# Patient Record
Sex: Female | Born: 1985 | Race: Black or African American | Hispanic: No | Marital: Single | Smoking: Current some day smoker
Health system: Southern US, Community
[De-identification: ages and names within clinical notes are randomized; demographics above are authoritative.]

## PROBLEM LIST (undated history)

## (undated) DIAGNOSIS — Z789 Other specified health status: Secondary | ICD-10-CM

## (undated) HISTORY — PX: LEG SURGERY: SHX1003

---

## 2016-12-02 ENCOUNTER — Emergency Department (HOSPITAL_COMMUNITY)
Admission: EM | Admit: 2016-12-02 | Discharge: 2016-12-02 | Disposition: A | Discharge status: Home / Self Care | Payer: Self-pay | Attending: Emergency Medicine | Admitting: Emergency Medicine

## 2016-12-02 ENCOUNTER — Emergency Department (HOSPITAL_COMMUNITY): Payer: Self-pay

## 2016-12-02 ENCOUNTER — Encounter (HOSPITAL_COMMUNITY): Payer: Self-pay | Admitting: Emergency Medicine

## 2016-12-02 DIAGNOSIS — R1030 Lower abdominal pain, unspecified: Secondary | ICD-10-CM

## 2016-12-02 DIAGNOSIS — R7989 Other specified abnormal findings of blood chemistry: Secondary | ICD-10-CM

## 2016-12-02 DIAGNOSIS — A599 Trichomoniasis, unspecified: Secondary | ICD-10-CM

## 2016-12-02 DIAGNOSIS — Z91040 Latex allergy status: Secondary | ICD-10-CM | POA: Insufficient documentation

## 2016-12-02 DIAGNOSIS — E349 Endocrine disorder, unspecified: Secondary | ICD-10-CM | POA: Insufficient documentation

## 2016-12-02 DIAGNOSIS — R1032 Left lower quadrant pain: Secondary | ICD-10-CM

## 2016-12-02 LAB — COMPREHENSIVE METABOLIC PANEL
ALBUMIN: 4.3 g/dL (ref 3.5–5.0)
ALT: 11 U/L — ABNORMAL LOW (ref 14–54)
ANION GAP: 6 (ref 5–15)
AST: 23 U/L (ref 15–41)
Alkaline Phosphatase: 37 U/L — ABNORMAL LOW (ref 38–126)
BUN: 9 mg/dL (ref 6–20)
CHLORIDE: 106 mmol/L (ref 101–111)
CO2: 24 mmol/L (ref 22–32)
Calcium: 9.2 mg/dL (ref 8.9–10.3)
Creatinine, Ser: 0.7 mg/dL (ref 0.44–1.00)
GFR calc Af Amer: >60 mL/min (ref >60)
GFR calc non Af Amer: >60 mL/min (ref >60)
GLUCOSE: 84 mg/dL (ref 65–99)
POTASSIUM: 3.8 mmol/L (ref 3.5–5.1)
SODIUM: 136 mmol/L (ref 135–145)
TOTAL PROTEIN: 7.2 g/dL (ref 6.5–8.1)
Total Bilirubin: 1.1 mg/dL (ref 0.3–1.2)

## 2016-12-02 LAB — RPR: RPR Ser Ql: NONREACTIVE

## 2016-12-02 LAB — RH IG WORKUP (INCLUDES ABO/RH)
ABO/RH(D): O POS
Gestational Age(Wks): 1

## 2016-12-02 LAB — URINALYSIS, ROUTINE W REFLEX MICROSCOPIC
BACTERIA UA: NONE SEEN
BILIRUBIN URINE: NEGATIVE
Glucose, UA: NEGATIVE mg/dL
HGB URINE DIPSTICK: NEGATIVE
Ketones, ur: NEGATIVE mg/dL
NITRITE: NEGATIVE
PROTEIN: NEGATIVE mg/dL
Specific Gravity, Urine: 1.018 (ref 1.005–1.030)
pH: 6 (ref 5.0–8.0)

## 2016-12-02 LAB — CBC
HCT: 37.5 % (ref 36.0–46.0)
HEMOGLOBIN: 12.4 g/dL (ref 12.0–15.0)
MCH: 32.4 pg (ref 26.0–34.0)
MCHC: 33.1 g/dL (ref 30.0–36.0)
MCV: 97.9 fL (ref 78.0–100.0)
Platelets: 199 10*3/uL (ref 150–400)
RBC: 3.83 MIL/uL — ABNORMAL LOW (ref 3.87–5.11)
RDW: 13 % (ref 11.5–15.5)
WBC: 4.3 10*3/uL (ref 4.0–10.5)

## 2016-12-02 LAB — I-STAT BETA HCG BLOOD, ED (MC, WL, AP ONLY): HCG, QUANTITATIVE: 225.3 m[IU]/mL — AB (ref <5)

## 2016-12-02 LAB — HCG, QUANTITATIVE, PREGNANCY: HCG, BETA CHAIN, QUANT, S: 207 m[IU]/mL — AB (ref <5)

## 2016-12-02 LAB — WET PREP, GENITAL
Sperm: NONE SEEN
YEAST WET PREP: NONE SEEN

## 2016-12-02 LAB — HIV ANTIBODY (ROUTINE TESTING W REFLEX): HIV SCREEN 4TH GENERATION: NONREACTIVE

## 2016-12-02 LAB — GC/CHLAMYDIA PROBE AMP (~~LOC~~) NOT AT ARMC
Chlamydia: POSITIVE — AB
Neisseria Gonorrhea: NEGATIVE

## 2016-12-02 LAB — LIPASE, BLOOD: LIPASE: 26 U/L (ref 11–51)

## 2016-12-02 MED ORDER — MORPHINE SULFATE (PF) 4 MG/ML IV SOLN
4.0000 mg | Freq: Once | INTRAVENOUS | Status: AC
  Administered 2016-12-02: 4 mg via INTRAVENOUS
  Filled 2016-12-02: qty 1

## 2016-12-02 MED ORDER — SODIUM CHLORIDE 0.9 % IV SOLN
INTRAVENOUS | Status: DC
  Administered 2016-12-02: 07:00:00 via INTRAVENOUS

## 2016-12-02 MED ORDER — METRONIDAZOLE 500 MG PO TABS
500.0000 mg | ORAL_TABLET | Freq: Once | ORAL | Status: AC
  Administered 2016-12-02: 500 mg via ORAL
  Filled 2016-12-02: qty 1

## 2016-12-02 MED ORDER — ONDANSETRON HCL 4 MG/2ML IJ SOLN
4.0000 mg | Freq: Once | INTRAMUSCULAR | Status: AC
  Administered 2016-12-02: 4 mg via INTRAVENOUS
  Filled 2016-12-02: qty 2

## 2016-12-02 MED ORDER — DEXTROSE 5 % IV SOLN
1.0000 g | Freq: Once | INTRAVENOUS | Status: AC
  Administered 2016-12-02: 1 g via INTRAVENOUS
  Filled 2016-12-02: qty 10

## 2016-12-02 MED ORDER — HYDROCODONE-ACETAMINOPHEN 5-325 MG PO TABS
1.0000 | ORAL_TABLET | Freq: Once | ORAL | Status: AC
  Administered 2016-12-02: 1 via ORAL
  Filled 2016-12-02: qty 1

## 2016-12-02 MED ORDER — HYDROCODONE-ACETAMINOPHEN 5-325 MG PO TABS: ORAL_TABLET | ORAL | 0 refills | Status: DC

## 2016-12-02 MED ORDER — AZITHROMYCIN 250 MG PO TABS
1000.0000 mg | ORAL_TABLET | Freq: Once | ORAL | Status: AC
  Administered 2016-12-02: 1000 mg via ORAL
  Filled 2016-12-02: qty 4

## 2016-12-02 MED ORDER — ONDANSETRON 4 MG PO TBDP
4.0000 mg | ORAL_TABLET | Freq: Once | ORAL | Status: AC
  Administered 2016-12-02: 4 mg via ORAL
  Filled 2016-12-02: qty 1

## 2016-12-02 MED ORDER — METRONIDAZOLE 500 MG PO TABS: 500.0000 mg | ORAL_TABLET | Freq: Two times a day (BID) | ORAL | 0 refills | Status: DC

## 2016-12-02 NOTE — ED Provider Notes (Signed)
[facility]-EMERGENCY DEPT Provider Note   CSN: [id number] Arrival date & time: 12/02/16  4098     History   Chief Complaint Chief Complaint  Patient presents with  . Menstrual Problem    HPI   Blood pressure (!) 134/92, pulse (!) 110, temperature 98.5 F (36.9 C), temperature source Oral, resp. rate 18, height 5\' 3"  (1.6 m), weight 50.8 kg (112 lb), last menstrual period 11/16/2016, SpO2 100 %.  Paula Molina is a 31 y.o. female G?P1 complaining of vaginal spotting severe left lower quadrant abdominal pain intermittently but worsening since midnight last night. States that her menses are normally regular, she started menstruating on May 26 and it has been intermittent. She endorses headache shortness of breath and weakness associated. She denies syncope, abnormal vaginal discharge, dysuria, urinary frequency. She notes an episode of severe dyspareunia. States that the abdominal pain is worsened with changing position moving and walking. Pt states that she had been trying for 5 years for get pregnant, but has not  History reviewed. No pertinent past medical history.  There are no active problems to display for this patient.   Past Surgical History:  Procedure Laterality Date  . LEG SURGERY      OB History    No data available       Home Medications    Prior to Admission medications   Medication Sig Start Date End Date Taking? Authorizing Provider  aspirin-acetaminophen-caffeine (EXCEDRIN MIGRAINE) [id number] MG tablet Take 1 tablet by mouth every 6 (six) hours as needed for headache.   Yes [provider]  Aspirin-Acetaminophen-Caffeine (GOODY HEADACHE PO) Take 1 packet by mouth daily as needed (headache).   Yes [provider]  HYDROcodone-acetaminophen (NORCO/VICODIN) 5-325 MG tablet Take 1-2 tablets by mouth every 6 hours as needed for pain and/or cough. 12/02/16   Sherel Fennell, Joni Reining, PA-C  metroNIDAZOLE (FLAGYL) 500 MG tablet Take 1 tablet (500 mg  total) by mouth 2 (two) times daily. 12/02/16   Audrena Talaga, Mardella Layman    Family History No family history on file.  Social History Social History  Substance Use Topics  . Smoking status: Never Smoker  . Smokeless tobacco: Never Used  . Alcohol use Yes     Comment: rare     Allergies   Latex   Review of Systems Review of Systems  10 systems reviewed and found to be negative, except as noted in the HPI.   Physical Exam Updated Vital Signs BP 112/72   Pulse 78   Temp 98.5 F (36.9 C) (Oral)   Resp 16   Ht 5\' 3"  (1.6 m)   Wt 50.8 kg (112 lb)   LMP 11/16/2016   SpO2 100%   BMI 19.84 kg/m   Physical Exam  Constitutional: She is oriented to person, place, and time. She appears well-developed and well-nourished. No distress.  HENT:  Head: Normocephalic.  Mouth/Throat: Oropharynx is clear and moist.  Eyes: Conjunctivae and EOM are normal.  Neck: Normal range of motion.  Cardiovascular: Normal rate.   Pulmonary/Chest: Effort normal. No stridor.  Abdominal: Soft. Bowel sounds are normal. She exhibits no distension and no mass. There is tenderness. There is no rebound and no guarding.  Tender in the left lower quadrant without guarding or rebound, normoactive bowel sounds.  Genitourinary:  Genitourinary Comments: No CVA tenderness to percussion bilaterally  Pelvic exam is chaperoned by technician: No rashes or lesions, scant dark blood pooled in the posterior fornix. Very very mild tenderness on left adnexal palpation.  Musculoskeletal: Normal range of motion.  Neurological: She is alert and oriented to person, place, and time.  Psychiatric: She has a normal mood and affect.  Nursing note and vitals reviewed.    ED Treatments / Results  Labs (all labs ordered are listed, but only abnormal results are displayed) Labs Reviewed  WET PREP, GENITAL - Abnormal; Notable for the following:       Result Value   Trich, Wet Prep PRESENT (*)    Clue Cells Wet Prep HPF  POC PRESENT (*)    WBC, Wet Prep HPF POC MANY (*)    All other components within normal limits  COMPREHENSIVE METABOLIC PANEL - Abnormal; Notable for the following:    ALT 11 (*)    Alkaline Phosphatase 37 (*)    All other components within normal limits  CBC - Abnormal; Notable for the following:    RBC 3.83 (*)    All other components within normal limits  URINALYSIS, ROUTINE W REFLEX MICROSCOPIC - Abnormal; Notable for the following:    Leukocytes, UA TRACE (*)    Squamous Epithelial / LPF 0-5 (*)    All other components within normal limits  HCG, QUANTITATIVE, PREGNANCY - Abnormal; Notable for the following:    hCG, Beta Chain, Quant, S 207 (*)    All other components within normal limits  I-STAT BETA HCG BLOOD, ED ([facility], [facility], [facility] ONLY) - Abnormal; Notable for the following:    I-stat hCG, quantitative 225.3 (*)    All other components within normal limits  URINE CULTURE  LIPASE, BLOOD  RPR  HIV ANTIBODY (ROUTINE TESTING)  RH IG WORKUP (INCLUDES ABO/RH)  GC/CHLAMYDIA PROBE AMP ([place]) NOT AT [facility]    EKG  EKG Interpretation None       Radiology US Ob Comp Less 14 Wks  Result Date: 12/02/2016 CLINICAL DATA:  Bleeding since May 26. Left pelvic pain. Quantitative beta HCG 207. EXAM: OBSTETRIC <14 WK [place] AND TRANSVAGINAL OB US DOPPLER ULTRASOUND OF OVARIES TECHNIQUE: Both transabdominal and transvaginal ultrasound examinations were performed for complete evaluation of the gestation as well as the maternal uterus, adnexal regions, and pelvic cul-de-sac. Transvaginal technique was performed to assess early pregnancy. Color and duplex Doppler ultrasound was utilized to evaluate blood flow to the ovaries. COMPARISON:  None. FINDINGS: No intra or extrauterine gestational sac is identified. No extra ovarian mass. No complex or large volume pelvic fluid. The uterus is retroflexed, otherwise unremarkable. A nonvascular mass in the right ovary with internal lace-like appearance  consistent with a hemorrhagic cyst, up to 3.7 cm in diameter. The symptomatic left ovary is normal in size and appearance. Pulsed Doppler evaluation of both ovaries demonstrates normal appearing low-resistance arterial and venous waveforms. IMPRESSION: 1. Pregnancy of unknown location. Differential considerations include intrauterine gestation too early to be sonographically visualized, spontaneous abortion, or ectopic pregnancy. Consider follow-up ultrasound in 10 days and serial quantitative beta HCG follow-up. 2. Negative for ovarian torsion. On the symptomatic left side, the ovary has a normal appearance. 3. 3.7 cm probable hemorrhagic cyst in the right ovary. Electronically Signed   By: Marnee Spring M.D.   On: 12/02/2016 08:46   US Ob Transvaginal  Result Date: 12/02/2016 CLINICAL DATA:  Bleeding since May 26. Left pelvic pain. Quantitative beta HCG 207. EXAM: OBSTETRIC <14 WK [place] AND TRANSVAGINAL OB US DOPPLER ULTRASOUND OF OVARIES TECHNIQUE: Both transabdominal and transvaginal ultrasound examinations were performed for complete evaluation of the gestation as well as the maternal uterus, adnexal  regions, and pelvic cul-de-sac. Transvaginal technique was performed to assess early pregnancy. Color and duplex Doppler ultrasound was utilized to evaluate blood flow to the ovaries. COMPARISON:  None. FINDINGS: No intra or extrauterine gestational sac is identified. No extra ovarian mass. No complex or large volume pelvic fluid. The uterus is retroflexed, otherwise unremarkable. A nonvascular mass in the right ovary with internal lace-like appearance consistent with a hemorrhagic cyst, up to 3.7 cm in diameter. The symptomatic left ovary is normal in size and appearance. Pulsed Doppler evaluation of both ovaries demonstrates normal appearing low-resistance arterial and venous waveforms. IMPRESSION: 1. Pregnancy of unknown location. Differential considerations include intrauterine gestation too early to be  sonographically visualized, spontaneous abortion, or ectopic pregnancy. Consider follow-up ultrasound in 10 days and serial quantitative beta HCG follow-up. 2. Negative for ovarian torsion. On the symptomatic left side, the ovary has a normal appearance. 3. 3.7 cm probable hemorrhagic cyst in the right ovary. Electronically Signed   By: Marnee Spring M.D.   On: 12/02/2016 08:46   [place] Art/ven Flow Abd Pelv Doppler  Result Date: 12/02/2016 CLINICAL DATA:  Bleeding since May 26. Left pelvic pain. Quantitative beta HCG 207. EXAM: OBSTETRIC <14 WK [place] AND TRANSVAGINAL OB US DOPPLER ULTRASOUND OF OVARIES TECHNIQUE: Both transabdominal and transvaginal ultrasound examinations were performed for complete evaluation of the gestation as well as the maternal uterus, adnexal regions, and pelvic cul-de-sac. Transvaginal technique was performed to assess early pregnancy. Color and duplex Doppler ultrasound was utilized to evaluate blood flow to the ovaries. COMPARISON:  None. FINDINGS: No intra or extrauterine gestational sac is identified. No extra ovarian mass. No complex or large volume pelvic fluid. The uterus is retroflexed, otherwise unremarkable. A nonvascular mass in the right ovary with internal lace-like appearance consistent with a hemorrhagic cyst, up to 3.7 cm in diameter. The symptomatic left ovary is normal in size and appearance. Pulsed Doppler evaluation of both ovaries demonstrates normal appearing low-resistance arterial and venous waveforms. IMPRESSION: 1. Pregnancy of unknown location. Differential considerations include intrauterine gestation too early to be sonographically visualized, spontaneous abortion, or ectopic pregnancy. Consider follow-up ultrasound in 10 days and serial quantitative beta HCG follow-up. 2. Negative for ovarian torsion. On the symptomatic left side, the ovary has a normal appearance. 3. 3.7 cm probable hemorrhagic cyst in the right ovary. Electronically Signed   By: Marnee Spring M.D.   On: 12/02/2016 08:46    Procedures Procedures (including critical care time)  Medications Ordered in ED Medications  0.9 %  sodium chloride infusion ( Intravenous Stopped 12/02/16 1154)  morphine 4 MG/ML injection 4 mg (4 mg Intravenous Given 12/02/16 0659)  ondansetron (ZOFRAN) injection 4 mg (4 mg Intravenous Given 12/02/16 0659)  morphine 4 MG/ML injection 4 mg (4 mg Intravenous Given 12/02/16 0907)  ondansetron (ZOFRAN) injection 4 mg (4 mg Intravenous Given 12/02/16 1045)  cefTRIAXone (ROCEPHIN) 1 g in dextrose 5 % 50 mL IVPB (0 g Intravenous Stopped 12/02/16 1156)  azithromycin (ZITHROMAX) tablet 1,000 mg (1,000 mg Oral Given 12/02/16 1058)  metroNIDAZOLE (FLAGYL) tablet 500 mg (500 mg Oral Given 12/02/16 1058)     Initial Impression / Assessment and Plan / ED Course  I have reviewed the triage vital signs and the nursing notes.  Pertinent labs & imaging results that were available during my care of the patient were reviewed by me and considered in my medical decision making (see chart for details).     Vitals:   12/02/16 1045 12/02/16 1100 12/02/16 1135  12/02/16 1146  BP: (!) 120/59 (!) 141/86 108/78 112/72  Pulse: 66 74 70 78  Resp: 16  16 16   Temp:      TempSrc:      SpO2: 100% 100% 100% 100%  Weight:      Height:        Medications  0.9 %  sodium chloride infusion ( Intravenous Stopped 12/02/16 1154)  morphine 4 MG/ML injection 4 mg (4 mg Intravenous Given 12/02/16 0659)  ondansetron (ZOFRAN) injection 4 mg (4 mg Intravenous Given 12/02/16 0659)  morphine 4 MG/ML injection 4 mg (4 mg Intravenous Given 12/02/16 0907)  ondansetron (ZOFRAN) injection 4 mg (4 mg Intravenous Given 12/02/16 1045)  cefTRIAXone (ROCEPHIN) 1 g in dextrose 5 % 50 mL IVPB (0 g Intravenous Stopped 12/02/16 1156)  azithromycin (ZITHROMAX) tablet 1,000 mg (1,000 mg Oral Given 12/02/16 1058)  metroNIDAZOLE (FLAGYL) tablet 500 mg (500 mg Oral Given 12/02/16 1058)    Paula Molina is 31  y.o. female presenting with Vaginal spotting with severe left lower quadrant pain. Concern for ectopic, patient made nothing by mouth. Abdominal exam is nonsurgical. Initially tachycardic however, this is resolved on my exam. Associated symptoms of weakness, headache and shortness of breath. Patient O+, CBC with no significant anemia. I-STAT beta hCG confirmed with a quantitative hCG of 207.   Ultrasound with no intra-or extrauterine signs of positional sac. Incidentally noted is a right-sided ovarian cyst. Consider early pregnancy versus completed abortion versus ectopic. Will consult OB/GYN.  Wet prep with trichomoniasis, clue cells and many white blood cells.  Discussed case with attending physician who agrees with care plan and disposition.   OB/GYN consult from Dr. Vergie LivingPickens appreciated: He advises to treat for PID with Rocephin and azithromycin. He has looked at the ultrasound and agrees that she is stable for discharge states that [facility] to [facility] up an appointment for repeat drug blood draw in 2 days. Counseled her that her partner will need to be treated for trichomoniasis.  Extensive discussion of return for cautions for ectopic. Patient verbalized her understanding and teach back technique.  Discussed treatment options with pharmacist Twe as metronidazole is not recommended in the first trimester. She is discussed this with [facility] confirms that they do use metronidazole and the first trimester. She recommends initiating treatment.   Final Clinical Impressions(s) / ED Diagnoses   Final diagnoses:  Lower abdominal pain  Elevated serum hCG  LLQ pain  Trichimoniasis    New Prescriptions New Prescriptions   HYDROCODONE-ACETAMINOPHEN (NORCO/VICODIN) 5-325 MG TABLET    Take 1-2 tablets by mouth every 6 hours as needed for pain and/or cough.   METRONIDAZOLE (FLAGYL) 500 MG TABLET    Take 1 tablet (500 mg total) by mouth 2 (two) times daily.      Kaylyn Limisciotta, Juanita Streight, PA-C 12/02/16 1225    Arby BarrettePfeiffer, Marcy, MD 12/04/16 [phone]

## 2016-12-02 NOTE — ED Triage Notes (Signed)
Pt also c/o having nausea and dizziness

## 2016-12-02 NOTE — Discharge Instructions (Signed)
It is very important that you follow-up for repeat blood work in 48 hours. Do not hesitate to return to the [facility] or any close emergency room if you have worsening abdominal pain, heavy vaginal bleeding, feel like you're going to pass out, feel short of breath or  heart is racing.

## 2016-12-02 NOTE — ED Notes (Signed)
Gave pt saltine crackers, peanut butter & water, per Joni ReiningNicole - PA.

## 2016-12-02 NOTE — ED Triage Notes (Signed)
Pt reports vaginal bleeding ongoing since May 26th. Pt reports vaginal bleed  Stopped for 1 day (5 days after the 26th), then started back up again after having intercourse. Pt also reports severe abdominal pain during intercourse and continues today.

## 2016-12-02 NOTE — ED Notes (Signed)
Patient transported back from US 

## 2016-12-02 NOTE — ED Notes (Signed)
Patient given food per edp approval.

## 2016-12-03 LAB — URINE CULTURE: Culture: <10000 — AB

## 2016-12-04 ENCOUNTER — Ambulatory Visit: Payer: Self-pay

## 2016-12-04 ENCOUNTER — Encounter (HOSPITAL_COMMUNITY): Payer: Self-pay | Admitting: *Deleted

## 2016-12-04 ENCOUNTER — Inpatient Hospital Stay (HOSPITAL_COMMUNITY)
Admission: AD | Admit: 2016-12-04 | Discharge: 2016-12-04 | Disposition: A | Discharge status: Home / Self Care | Payer: Self-pay | Source: Ambulatory Visit | Attending: Obstetrics & Gynecology | Admitting: Obstetrics & Gynecology

## 2016-12-04 ENCOUNTER — Inpatient Hospital Stay (HOSPITAL_COMMUNITY): Payer: Self-pay

## 2016-12-04 DIAGNOSIS — Z3A00 Weeks of gestation of pregnancy not specified: Secondary | ICD-10-CM | POA: Insufficient documentation

## 2016-12-04 DIAGNOSIS — O26899 Other specified pregnancy related conditions, unspecified trimester: Secondary | ICD-10-CM | POA: Insufficient documentation

## 2016-12-04 DIAGNOSIS — O3680X0 Pregnancy with inconclusive fetal viability, not applicable or unspecified: Secondary | ICD-10-CM | POA: Insufficient documentation

## 2016-12-04 DIAGNOSIS — Z3201 Encounter for pregnancy test, result positive: Secondary | ICD-10-CM | POA: Insufficient documentation

## 2016-12-04 DIAGNOSIS — O26891 Other specified pregnancy related conditions, first trimester: Secondary | ICD-10-CM

## 2016-12-04 DIAGNOSIS — R102 Pelvic and perineal pain: Secondary | ICD-10-CM

## 2016-12-04 DIAGNOSIS — Z7982 Long term (current) use of aspirin: Secondary | ICD-10-CM | POA: Insufficient documentation

## 2016-12-04 DIAGNOSIS — Z9889 Other specified postprocedural states: Secondary | ICD-10-CM | POA: Insufficient documentation

## 2016-12-04 DIAGNOSIS — N730 Acute parametritis and pelvic cellulitis: Secondary | ICD-10-CM | POA: Insufficient documentation

## 2016-12-04 DIAGNOSIS — Z91040 Latex allergy status: Secondary | ICD-10-CM | POA: Insufficient documentation

## 2016-12-04 DIAGNOSIS — Z3491 Encounter for supervision of normal pregnancy, unspecified, first trimester: Secondary | ICD-10-CM

## 2016-12-04 LAB — CBC
HCT: 38.7 % (ref 36.0–46.0)
HEMOGLOBIN: 13 g/dL (ref 12.0–15.0)
MCH: 32.7 pg (ref 26.0–34.0)
MCHC: 33.6 g/dL (ref 30.0–36.0)
MCV: 97.2 fL (ref 78.0–100.0)
Platelets: 178 10*3/uL (ref 150–400)
RBC: 3.98 MIL/uL (ref 3.87–5.11)
RDW: 13.1 % (ref 11.5–15.5)
WBC: 3.9 10*3/uL — ABNORMAL LOW (ref 4.0–10.5)

## 2016-12-04 LAB — HCG, QUANTITATIVE, PREGNANCY: HCG, BETA CHAIN, QUANT, S: 229 m[IU]/mL — AB (ref <5)

## 2016-12-04 MED ORDER — OXYCODONE-ACETAMINOPHEN 5-325 MG PO TABS
1.0000 | ORAL_TABLET | Freq: Four times a day (QID) | ORAL | Status: DC | PRN
Start: 2016-12-04 — End: 2016-12-04
  Administered 2016-12-04: 1 via ORAL
  Filled 2016-12-04: qty 1

## 2016-12-04 MED ORDER — IBUPROFEN 800 MG PO TABS: 800.0000 mg | ORAL_TABLET | Freq: Three times a day (TID) | ORAL | 0 refills | Status: DC | PRN

## 2016-12-04 NOTE — MAU Provider Note (Signed)
History     CSN: [id number]  Arrival date and time: 12/04/16 1141   First Provider Initiated Contact with Patient 12/04/16 1203      No chief complaint on file.  G2P1001 at unknown gestation here with worsening LLQ pain. Pain became worse this am. She rates 8/10. She has not taken anything for it. She describes as a pulling from her lower abdomen to her rectum, more pain on left. She was seen in the ED 2 days ago for similar pain and was found to have a positive pregnancy test and no IUP or obvious ectopic (incidentally rt hemorrhagic cyst), and was treated for PID. Trich and CT was positive. She also reports bleeding or spotting for the last 2 1/2 weeks. Flow today is light and dark red. She has used 3 tampons today, partially soaked.    OB History    Gravida Para Term Preterm AB Living   2 1 1     1    SAB TAB Ectopic Multiple Live Births           1      History reviewed. No pertinent past medical history.  Past Surgical History:  Procedure Laterality Date  . LEG SURGERY      History reviewed. No pertinent family history.  Social History  Substance Use Topics  . Smoking status: Never Smoker  . Smokeless tobacco: Never Used  . Alcohol use Yes     Comment: rare    Allergies:  Allergies  Allergen Reactions  . Latex Rash    Prescriptions Prior to Admission  Medication Sig Dispense Refill Last Dose  . aspirin-acetaminophen-caffeine (EXCEDRIN MIGRAINE) 250-250-65 MG tablet Take 1 tablet by mouth every 6 (six) hours as needed for headache.   Past Month at Unknown time  . Aspirin-Acetaminophen-Caffeine (GOODY HEADACHE PO) Take 1 packet by mouth daily as needed (headache).   Past Week at Unknown time  . HYDROcodone-acetaminophen (NORCO/VICODIN) 5-325 MG tablet Take 1-2 tablets by mouth every 6 hours as needed for pain and/or cough. 7 tablet 0 Past Week at Unknown time  . metroNIDAZOLE (FLAGYL) 500 MG tablet Take 1 tablet (500 mg total) by mouth 2 (two) times daily. 14  tablet 0 12/03/2016 at Unknown time    Review of Systems  Constitutional: Negative for chills and fever.  Gastrointestinal: Positive for abdominal pain (LLQ).  Genitourinary: Positive for vaginal bleeding.   Physical Exam   Blood pressure 123/83, pulse 72, temperature 98.1 F (36.7 C), resp. rate 16, last menstrual period 11/16/2016.  Physical Exam  Nursing note and vitals reviewed. Constitutional: She is oriented to person, place, and time. She appears well-developed and well-nourished. No distress.  HENT:  Head: Normocephalic and atraumatic.  Neck: Normal range of motion.  Cardiovascular: Normal rate.   Respiratory: Effort normal.  GI: Soft. She exhibits no distension and no mass. There is no tenderness. There is no rebound and no guarding.  Genitourinary:  Genitourinary Comments: External: no lesions or erythema Vagina: rugated, pink, moist, scant brown discharge Uterus: non enlarged, anteverted, + tender, ++ CMT Adnexae: no masses, no tenderness left, no tenderness right   Musculoskeletal: Normal range of motion.  Neurological: She is alert and oriented to person, place, and time.  Skin: Skin is warm and dry.  Psychiatric: She has a normal mood and affect.   Results for orders placed or performed during the hospital encounter of 12/04/16 (from the past 24 hour(s))  CBC     Status: Abnormal   Collection  Time: 12/04/16 12:07 PM  Result Value Ref Range   WBC 3.9 (L) 4.0 - 10.5 K/uL   RBC 3.98 3.87 - 5.11 MIL/uL   Hemoglobin 13.0 12.0 - 15.0 g/dL   HCT 09.838.7 11.936.0 - 14.746.0 %   MCV 97.2 78.0 - 100.0 fL   MCH 32.7 26.0 - 34.0 pg   MCHC 33.6 30.0 - 36.0 g/dL   RDW 82.913.1 56.211.5 - 13.015.5 %   Platelets 178 150 - 400 K/uL  hCG, quantitative, pregnancy     Status: Abnormal   Collection Time: 12/04/16 12:07 PM  Result Value Ref Range   hCG, Beta Chain, Quant, S 229 (H) <5 mIU/mL   [place] Ob Transvaginal  Result Date: 12/04/2016 CLINICAL DATA:  Pregnancy of unknown location, left pelvic  pain, bleeding x3 days, beta HCG 229, previously 207 on 06/11 EXAM: TRANSVAGINAL OB ULTRASOUND TECHNIQUE: Transvaginal ultrasound was performed for complete evaluation of the gestation as well as the maternal uterus, adnexal regions, and pelvic cul-de-sac. COMPARISON:  12/02/2016 FINDINGS: Intrauterine gestational sac: None Yolk sac:  Not Visualized. Embryo:  Not Visualized. Subchorionic hemorrhage:  None visualized. Maternal uterus/adnexae: Endometrial complex measures 7 mm. 3.0 cm right ovarian hemorrhagic cyst. Left ovary is within normal limits. No free fluid. IMPRESSION: No IUP is visualized. By definition, in the setting of a positive pregnancy test, this reflects a pregnancy of unknown location. Differential considerations include abnormal IUP/missed abortion or nonvisualized ectopic pregnancy. Early normal IUP is not considered within the differential given the inappropriate rise in beta HCG. Serial beta HCG is suggested. Consider repeat pelvic ultrasound in 14 days, or earlier as clinically warranted. Electronically Signed   By: Charline BillsSriyesh  Krishnan M.D.   On: 12/04/2016 15:07   MAU Course  Procedures Percocet 2 tabs  MDM Labs ordered and reviewed. Pain improved after Percocet. Discussed with pt abnormal rise in quant HCG and no IUP seen again on todays US makes this suspicious for ectopic pregnancy, which can be life threatening. Quant HCG is low which may also indicate failed pregnancy or early pregnancy. Mngt options include MTX or rpt quant in 2 days and if continues to have abnormal rise then would recommend MTX then. She expresses this is a desired pregnancy as she was told 8 years ago she could no longer have children. She desires expectant mngt and rpt quant in 2 days. I reviewed strict ectopic precautions. Stable for discharge home.  Assessment and Plan   1. Pregnancy of unknown anatomic location   2. Pelvic pain affecting pregnancy   3. PID (acute pelvic inflammatory disease)     Discharge home Follow up in 2 days in [facility] at 8:00 am for labs Ectopic/return precautions Rx Ibuprofen   Allergies as of 12/04/2016      Reactions   Latex Rash      Medication List    STOP taking these medications   aspirin-acetaminophen-caffeine 250-250-65 MG tablet Commonly known as:  EXCEDRIN MIGRAINE   GOODY HEADACHE PO     TAKE these medications   HYDROcodone-acetaminophen 5-325 MG tablet Commonly known as:  NORCO/VICODIN Take 1-2 tablets by mouth every 6 hours as needed for pain and/or cough.   ibuprofen 800 MG tablet Commonly known as:  ADVIL,MOTRIN Take 1 tablet (800 mg total) by mouth every 8 (eight) hours as needed.   metroNIDAZOLE 500 MG tablet Commonly known as:  FLAGYL Take 1 tablet (500 mg total) by mouth 2 (two) times daily.      Donette LarryMelanie Adalai Perl, CNM 12/04/2016, 12:27  PM  

## 2016-12-04 NOTE — Discharge Instructions (Signed)

## 2016-12-04 NOTE — MAU Note (Signed)
Pt presents via EMS with complaints of pain in her left lower abdomen. [facility] and [facility]. Reports small amount of dark vaginal bleeding and is presently wearing a tampon

## 2016-12-06 ENCOUNTER — Inpatient Hospital Stay (HOSPITAL_COMMUNITY)
Admission: AD | Admit: 2016-12-06 | Discharge: 2016-12-06 | Disposition: A | Discharge status: Home / Self Care | Payer: Self-pay | Source: Ambulatory Visit | Attending: Obstetrics and Gynecology | Admitting: Obstetrics and Gynecology

## 2016-12-06 ENCOUNTER — Ambulatory Visit: Payer: Self-pay

## 2016-12-06 ENCOUNTER — Encounter (HOSPITAL_COMMUNITY): Payer: Self-pay

## 2016-12-06 DIAGNOSIS — O9989 Other specified diseases and conditions complicating pregnancy, childbirth and the puerperium: Secondary | ICD-10-CM

## 2016-12-06 DIAGNOSIS — Z87891 Personal history of nicotine dependence: Secondary | ICD-10-CM | POA: Insufficient documentation

## 2016-12-06 DIAGNOSIS — Z3200 Encounter for pregnancy test, result unknown: Secondary | ICD-10-CM

## 2016-12-06 DIAGNOSIS — O3680X0 Pregnancy with inconclusive fetal viability, not applicable or unspecified: Secondary | ICD-10-CM

## 2016-12-06 DIAGNOSIS — O26891 Other specified pregnancy related conditions, first trimester: Secondary | ICD-10-CM | POA: Insufficient documentation

## 2016-12-06 DIAGNOSIS — O4690 Antepartum hemorrhage, unspecified, unspecified trimester: Secondary | ICD-10-CM

## 2016-12-06 DIAGNOSIS — Z3A01 Less than 8 weeks gestation of pregnancy: Secondary | ICD-10-CM | POA: Insufficient documentation

## 2016-12-06 DIAGNOSIS — R109 Unspecified abdominal pain: Secondary | ICD-10-CM | POA: Insufficient documentation

## 2016-12-06 DIAGNOSIS — O209 Hemorrhage in early pregnancy, unspecified: Secondary | ICD-10-CM | POA: Insufficient documentation

## 2016-12-06 HISTORY — DX: Other specified health status: Z78.9

## 2016-12-06 LAB — CBC WITH DIFFERENTIAL/PLATELET
Basophils Absolute: 0 10*3/uL (ref 0.0–0.1)
Basophils Relative: 0 %
Eosinophils Absolute: 0 10*3/uL (ref 0.0–0.7)
Eosinophils Relative: 0 %
HCT: 35.9 % — ABNORMAL LOW (ref 36.0–46.0)
Hemoglobin: 12.3 g/dL (ref 12.0–15.0)
LYMPHS PCT: 36 %
Lymphs Abs: 1.3 10*3/uL (ref 0.7–4.0)
MCH: 33.2 pg (ref 26.0–34.0)
MCHC: 34.3 g/dL (ref 30.0–36.0)
MCV: 96.8 fL (ref 78.0–100.0)
MONO ABS: 0.2 10*3/uL (ref 0.1–1.0)
MONOS PCT: 6 %
Neutro Abs: 2.1 10*3/uL (ref 1.7–7.7)
Neutrophils Relative %: 58 %
Platelets: 200 10*3/uL (ref 150–400)
RBC: 3.71 MIL/uL — ABNORMAL LOW (ref 3.87–5.11)
RDW: 13.2 % (ref 11.5–15.5)
WBC: 3.7 10*3/uL — ABNORMAL LOW (ref 4.0–10.5)

## 2016-12-06 LAB — COMPREHENSIVE METABOLIC PANEL
ALBUMIN: 4.3 g/dL (ref 3.5–5.0)
ALK PHOS: 34 U/L — AB (ref 38–126)
ALT: 12 U/L — ABNORMAL LOW (ref 14–54)
ANION GAP: 4 — AB (ref 5–15)
AST: 17 U/L (ref 15–41)
BUN: 15 mg/dL (ref 6–20)
CO2: 24 mmol/L (ref 22–32)
Calcium: 9.2 mg/dL (ref 8.9–10.3)
Chloride: 107 mmol/L (ref 101–111)
Creatinine, Ser: 0.63 mg/dL (ref 0.44–1.00)
GFR calc Af Amer: >60 mL/min (ref >60)
GFR calc non Af Amer: >60 mL/min (ref >60)
GLUCOSE: 82 mg/dL (ref 65–99)
POTASSIUM: 3.9 mmol/L (ref 3.5–5.1)
SODIUM: 135 mmol/L (ref 135–145)
Total Bilirubin: 0.5 mg/dL (ref 0.3–1.2)
Total Protein: 7.7 g/dL (ref 6.5–8.1)

## 2016-12-06 LAB — HCG, QUANTITATIVE, PREGNANCY: hCG, Beta Chain, Quant, S: 238 m[IU]/mL — ABNORMAL HIGH (ref <5)

## 2016-12-06 MED ORDER — METHOTREXATE INJECTION FOR WOMEN'S HOSPITAL
50.0000 mg/m2 | Freq: Once | INTRAMUSCULAR | Status: AC
  Administered 2016-12-06: 75 mg via INTRAMUSCULAR
  Filled 2016-12-06: qty 1.5

## 2016-12-06 NOTE — Progress Notes (Addendum)
G2P1 @ 2.[redacted] wksga. Presents to triage for bleeding and abdominal pain "fullness of abdomen" 4/10 pain level. Bleeding for about two weeks. has constipation and diarrhea.   1200: Provider at bs assessing pt. POC discussed with pt. Verbal order received that pt will get methatrexate.   Pending lab results. No order for methatrexate in cc yet.   1330: pt out side of room inquiring about the medicine. Pt appears upset and raising her voice and interrupting the charge nurse when nurse trying to explain. Pt demanding how long more she has to wait since she needed to pick up her child. Charge nurse informed may take 30 mins to process medication. Pt mumbled underbreath about rudeness when going back to room  Proivider called for med order since lab results back by charge nurse. Orders placed by provider. Pharmacy called to expedite medication.   1335: Went to pt's room to explain that pharmacy notified bc med order may take 30-40 mins to process but had called to expedite medication order.  Pt raising voice and cussing "f" word and asking why she was told that process here would take 2 hrs when it has now been 5 hrs since she came to her initial appt downstairs at 0800. I suggested that the FOB pickup child but she cussed me out telling me "don't tell me how to plan out my day". I cont to explain but pt kept on interrupting and raising voice. I asked pt, can I cont and explain? I informed that she will  need to wait 30 mins after med administion. "F bomb used again.   1347: medication received from pharmacy. Entered room with another nurse (orientee). Pt still acting out. Commented to orientee "i like youDesigner, fashion/clothing" Charge nurse called into room to administer methatrexate. Missing a piece on syringe. Pharmacy called for correction by charge nurse  1350: [facility] and her orientee.

## 2016-12-06 NOTE — Progress Notes (Signed)
Pt here today for STAT beta for unknown pregnancy location.  Pt states that she is still having some bleeding and continued abdominal pain.  Pt did not have any further questions.  I advised pt to please stay in the waiting room until her get her results which should take up to two hours.  Pt stated understanding with no further questions.  Notified Dr. Marice Potterove of pt's beta results.  Provider recommended to have pt go to MAU for management.  Pt notified of providers recommendation.  Notified MAU charge nurse.  Pt had no further questions.  Pt walked to MAU.

## 2016-12-06 NOTE — MAU Note (Signed)
Sent from clinic.  Continues to bleed and have pain, times in the day where both are worse.  Has been bleeding 5/26.  Feels like her whole abd is full, bloated sensation.  Going back and forth between constipation and diarrhea

## 2016-12-06 NOTE — MAU Provider Note (Signed)
History     CSN: [id number]  Arrival date and time: 12/06/16 1122   None     Chief Complaint  Patient presents with  . poss ectopic   HPI   Ms.Paula Molina is a 31 y.o. female G2P1001 @ 47w6dhere in MAU with abdominal pain. The pain is in both sides of her lower abdomen. " I feel bloated and full". The pain seems to linger on the left side. She has continued to have bleeding since 5/26. Patient says she is upset however knows this is not a normal pregnancy.   OB History    Gravida Para Term Preterm AB Living   2 1 1     1    SAB TAB Ectopic Multiple Live Births           1      Past Medical History:  Diagnosis Date  . Medical history non-contributory     Past Surgical History:  Procedure Laterality Date  . LEG SURGERY      Family History  Problem Relation Age of Onset  . Cancer Mother   . Depression Mother   . Hypertension Mother   . Miscarriages / [place]   . Cancer Father   . Asthma Brother   . Alcohol abuse Maternal Aunt   . Asthma Maternal Aunt   . Depression Maternal Aunt   . Hypertension Maternal Aunt   . Diabetes Maternal Uncle   . Hypertension Maternal Uncle   . Cancer Paternal Aunt   . Asthma Maternal Grandmother   . Diabetes Maternal Grandmother   . Hypertension Maternal Grandmother   . Miscarriages / Stillbirths Maternal Grandmother   . Varicose Veins Maternal Grandmother   . Arthritis Paternal Grandmother   . Cancer Paternal Grandmother   . Diabetes Paternal Grandmother   . Hypertension Paternal Grandmother     Social History  Substance Use Topics  . Smoking status: Former SResearch scientist (life sciences) . Smokeless tobacco: Never Used  . Alcohol use Yes     Comment: rare    Allergies:  Allergies  Allergen Reactions  . Latex Rash    Prescriptions Prior to Admission  Medication Sig Dispense Refill Last Dose  . HYDROcodone-acetaminophen (NORCO/VICODIN) 5-325 MG tablet Take 1-2 tablets by mouth every 6 hours as needed for pain and/or  cough. 7 tablet 0 Past Week at Unknown time  . ibuprofen (ADVIL,MOTRIN) 800 MG tablet Take 1 tablet (800 mg total) by mouth every 8 (eight) hours as needed. 30 tablet 0   . metroNIDAZOLE (FLAGYL) 500 MG tablet Take 1 tablet (500 mg total) by mouth 2 (two) times daily. 14 tablet 0 12/03/2016 at Unknown time   Results for orders placed or performed during the hospital encounter of 12/06/16 (from the past 48 hour(s))  Comprehensive metabolic panel     Status: Abnormal   Collection Time: 12/06/16 12:45 PM  Result Value Ref Range   Sodium 135 135 - 145 mmol/L   Potassium 3.9 3.5 - 5.1 mmol/L   Chloride 107 101 - 111 mmol/L   CO2 24 22 - 32 mmol/L   Glucose, Bld 82 65 - 99 mg/dL   BUN 15 6 - 20 mg/dL   Creatinine, Ser 0.63 0.44 - 1.00 mg/dL   Calcium 9.2 8.9 - 10.3 mg/dL   Total Protein 7.7 6.5 - 8.1 g/dL   Albumin 4.3 3.5 - 5.0 g/dL   AST 17 15 - 41 U/L   ALT 12 (L) 14 - 54 U/L  Alkaline Phosphatase 34 (L) 38 - 126 U/L   Total Bilirubin 0.5 0.3 - 1.2 mg/dL   GFR calc non Af Amer >60 >60 mL/min   GFR calc Af Amer >60 >60 mL/min    Comment: (NOTE) The eGFR has been calculated using the CKD EPI equation. This calculation has not been validated in all clinical situations. eGFR's persistently <60 mL/min signify possible Chronic Kidney Disease.    Anion gap 4 (L) 5 - 15  CBC with Differential     Status: Abnormal   Collection Time: 12/06/16 12:45 PM  Result Value Ref Range   WBC 3.7 (L) 4.0 - 10.5 K/uL   RBC 3.71 (L) 3.87 - 5.11 MIL/uL   Hemoglobin 12.3 12.0 - 15.0 g/dL   HCT 35.9 (L) 36.0 - 46.0 %   MCV 96.8 78.0 - 100.0 fL   MCH 33.2 26.0 - 34.0 pg   MCHC 34.3 30.0 - 36.0 g/dL   RDW 13.2 11.5 - 15.5 %   Platelets 200 150 - 400 K/uL   Neutrophils Relative % 58 %   Neutro Abs 2.1 1.7 - 7.7 K/uL   Lymphocytes Relative 36 %   Lymphs Abs 1.3 0.7 - 4.0 K/uL   Monocytes Relative 6 %   Monocytes Absolute 0.2 0.1 - 1.0 K/uL   Eosinophils Relative 0 %   Eosinophils Absolute 0.0 0.0 -  0.7 K/uL   Basophils Relative 0 %   Basophils Absolute 0.0 0.0 - 0.1 K/uL   Review of Systems  Constitutional: Negative for fever.  Gastrointestinal: Positive for abdominal pain.  Genitourinary: Positive for vaginal bleeding.   Physical Exam   Blood pressure 123/82, pulse 79, temperature 99.1 F (37.3 C), temperature source Oral, resp. rate 18, height 5' 3"  (1.6 m), weight 114 lb 8 oz (51.9 kg), last menstrual period 11/16/2016, SpO2 100 %.  Physical Exam  Constitutional: She is oriented to person, place, and time. She appears well-developed and well-nourished. No distress.  HENT:  Head: Normocephalic.  Respiratory: Effort normal.  GI: Soft. Normal appearance. There is tenderness in the left lower quadrant. There is no rigidity, no rebound and no guarding.  Musculoskeletal: Normal range of motion.  Neurological: She is alert and oriented to person, place, and time.  Skin: Skin is warm. She is not diaphoretic.  Psychiatric: Her behavior is normal.    MAU Course  Procedures  None  MDM  O positive blood type  Reviewed US done on 6/13 Quant: 6/11 207 Quant: 6/13 229 Quant 6/15  238 Patient continues to have LLQ pain and bleeding Discussed with Dr. Elly Modena Will offer the patient MTX.  Discussed [place] and labs in detail with the patient and significant other who are both agreeable to the plan of care. All questions answered.  CBC with diff and CMP reviewed  MTX given today in MAU.   Assessment and Plan   A:  1. Pregnancy of unknown anatomic location   2. Vaginal bleeding in pregnancy   3. Encounter for assessment for suspected ectopic pregnancy   4. Abdominal pain in pregnancy, first trimester     P:  Discharge home in stable condition Bleeding precautions Return to the [facility] on Monday for Day 4 quant Strict return precautions Ectopic precautions Pelvic rest Avoid folic acid rich foods.    Noni Saupe I, NP 12/06/2016 8:09 PM

## 2016-12-06 NOTE — Discharge Instructions (Signed)
Methotrexate Treatment for an Ectopic Pregnancy, Care After °Refer to this sheet in the next few weeks. These instructions provide you with information on caring for yourself after your procedure. Your health care provider may also give you more specific instructions. Your treatment has been planned according to current medical practices, but problems sometimes occur. Call your health care provider if you have any problems or questions after your procedure. °What can I expect after the procedure? °You may have some abdominal cramping, vaginal bleeding, and fatigue in the first few days after taking methotrexate. Some other possible side effects of methotrexate include: °· Nausea. °· Vomiting. °· Diarrhea. °· Mouth sores. °· Swelling or irritation of the lining of your lungs (pneumonitis). °· Liver damage. °· Hair loss. ° °Follow these instructions at home: °After you have received the methotrexate medicine, you need to be careful of your activities and watch your condition for several weeks. It may take 1 week before your hormone levels return to normal. °Activity °· Do not have sexual intercourse until your health care provider says it is safe to do so. °· You may resume your usual diet. °· Limit strenuous activity. °· Do not drink alcohol. °General instructions °· Do not take aspirin, ibuprofen, or naproxen (nonsteroidal anti-inflammatory drugs [NSAIDs]). °· Do not take folic acid, prenatal vitamins, or other vitamins that contain folic acid. °· Avoid traveling too far away from your health care provider. °· Keep all follow-up visits as told by your health care provider. This is important. °Contact a health care provider if: °· You cannot control your nausea and vomiting. °· You cannot control your diarrhea. °· You have sores in your mouth and want treatment. °· You need pain medicine for your abdominal pain. °· You have a rash. °· You are having a reaction to the medicine. °Get help right away if: °· You have  increasing abdominal or pelvic pain. °· You notice increased bleeding. °· You feel light-headed, or you faint. °· You have shortness of breath. °· Your heart rate increases. °· You have a cough. °· You have chills. °· You have a fever. °This information is not intended to replace advice given to you by your health care provider. Make sure you discuss any questions you have with your health care provider. °Document Released: 05/30/2011 Document Revised: 11/16/2015 Document Reviewed: 03/29/2013 °Elsevier Interactive Patient Education © 2017 Elsevier Inc. ° °

## 2016-12-09 ENCOUNTER — Ambulatory Visit: Payer: Self-pay

## 2016-12-09 DIAGNOSIS — Z79899 Other long term (current) drug therapy: Secondary | ICD-10-CM

## 2016-12-09 DIAGNOSIS — Z79631 Long term (current) use of antimetabolite agent: Secondary | ICD-10-CM

## 2016-12-09 LAB — HCG, QUANTITATIVE, PREGNANCY: hCG, Beta Chain, Quant, S: 241 m[IU]/mL — ABNORMAL HIGH (ref <5)

## 2016-12-09 NOTE — Progress Notes (Signed)
Pt here for day 4 s/p methotrexate tx for ectopic.  Pt stated that she is still having some bleeding with little pain.  Notified Dr. Debroah LoopArnold of pt beta results.  Provider recommendation was to have come back in three days for another STAT beta and that she will need to stay for another two hours.  Pt stated understanding with no further questions.

## 2016-12-12 ENCOUNTER — Ambulatory Visit (INDEPENDENT_AMBULATORY_CARE_PROVIDER_SITE_OTHER): Payer: Self-pay | Admitting: Obstetrics and Gynecology

## 2016-12-12 DIAGNOSIS — O00109 Unspecified tubal pregnancy without intrauterine pregnancy: Secondary | ICD-10-CM

## 2016-12-12 LAB — HCG, QUANTITATIVE, PREGNANCY: HCG, BETA CHAIN, QUANT, S: 228 m[IU]/mL — AB (ref <5)

## 2016-12-12 NOTE — Progress Notes (Signed)
Pt in for day 7 methotrexate. Discussed results with Dr. Jolayne Pantheronstant, pt needs to go back to MAU for repeat methotrexate.

## 2016-12-12 NOTE — Progress Notes (Signed)
31 yo here for day 7 labs with minimal decrease in HCG. Patient informed of results and need for repeat MTX. Patient states that she has to go to work and would like to see her daughter before her shift. She plans to present to the MAU tomorrow morning following her evening shift.  Precautions reviewed with the patient and the patient was instructed to present to the MAU sooner if abdominal pain worsens

## 2016-12-13 ENCOUNTER — Inpatient Hospital Stay (HOSPITAL_COMMUNITY): Payer: Self-pay

## 2016-12-13 ENCOUNTER — Inpatient Hospital Stay (HOSPITAL_COMMUNITY)
Admission: AD | Admit: 2016-12-13 | Discharge: 2016-12-13 | Disposition: A | Discharge status: Home / Self Care | Payer: Self-pay | Source: Ambulatory Visit | Attending: Family Medicine | Admitting: Family Medicine

## 2016-12-13 ENCOUNTER — Encounter (HOSPITAL_COMMUNITY): Payer: Self-pay | Admitting: *Deleted

## 2016-12-13 DIAGNOSIS — O009 Unspecified ectopic pregnancy without intrauterine pregnancy: Secondary | ICD-10-CM

## 2016-12-13 DIAGNOSIS — Z87891 Personal history of nicotine dependence: Secondary | ICD-10-CM | POA: Insufficient documentation

## 2016-12-13 DIAGNOSIS — R109 Unspecified abdominal pain: Secondary | ICD-10-CM

## 2016-12-13 LAB — CREATININE, SERUM
Creatinine, Ser: 0.56 mg/dL (ref 0.44–1.00)
GFR calc Af Amer: >60 mL/min (ref >60)
GFR calc non Af Amer: >60 mL/min (ref >60)

## 2016-12-13 LAB — CBC WITH DIFFERENTIAL/PLATELET
Basophils Absolute: 0 10*3/uL (ref 0.0–0.1)
Basophils Relative: 0 %
EOS PCT: 1 %
Eosinophils Absolute: 0 10*3/uL (ref 0.0–0.7)
HEMATOCRIT: 35.5 % — AB (ref 36.0–46.0)
Hemoglobin: 11.8 g/dL — ABNORMAL LOW (ref 12.0–15.0)
LYMPHS PCT: 34 %
Lymphs Abs: 1.3 10*3/uL (ref 0.7–4.0)
MCH: 32.5 pg (ref 26.0–34.0)
MCHC: 33.2 g/dL (ref 30.0–36.0)
MCV: 97.8 fL (ref 78.0–100.0)
MONO ABS: 0.3 10*3/uL (ref 0.1–1.0)
Monocytes Relative: 8 %
Neutro Abs: 2.2 10*3/uL (ref 1.7–7.7)
Neutrophils Relative %: 57 %
PLATELETS: 177 10*3/uL (ref 150–400)
RBC: 3.63 MIL/uL — AB (ref 3.87–5.11)
RDW: 13.2 % (ref 11.5–15.5)
WBC: 3.7 10*3/uL — AB (ref 4.0–10.5)

## 2016-12-13 LAB — AST: AST: 22 U/L (ref 15–41)

## 2016-12-13 LAB — BUN: BUN: 12 mg/dL (ref 6–20)

## 2016-12-13 MED ORDER — ONDANSETRON 4 MG PO TBDP
4.0000 mg | ORAL_TABLET | Freq: Once | ORAL | Status: AC
Start: 2016-12-13 — End: 2016-12-13
  Administered 2016-12-13: 4 mg via ORAL
  Filled 2016-12-13: qty 1

## 2016-12-13 MED ORDER — METHOTREXATE INJECTION FOR WOMEN'S HOSPITAL
50.0000 mg/m2 | Freq: Once | INTRAMUSCULAR | Status: AC
  Administered 2016-12-13: 75 mg via INTRAMUSCULAR
  Filled 2016-12-13: qty 1.5

## 2016-12-13 NOTE — MAU Note (Addendum)
Pt seen in clinic yesterday by Dr. Jolayne Pantheronstant, was told she needs MTX, pt unable to stay yesterday, is here today for evaluation.  Pt C/O lower abd pain since around June 10, continues to have vaginal bleeding - changed 4 pads during the night, not completely saturated, works night shift, was dizzy at work last night.

## 2016-12-13 NOTE — MAU Provider Note (Signed)
History     CSN: [id number]  Arrival date and time: 12/13/16 1002   First Provider Initiated Contact with Patient 12/13/16 1242      Chief Complaint  Patient presents with  . methotrexate   HPI  [name] is a 31 yo G2P1001 at 3.[redacted] wks gestation by u/s presenting to MAU today for a 2nd injection of MTX, abdominal pain that has not improved and nausea.  She works night shift at a gas station and "felt faint; almost passing out all night and vomiting off and on".  She was seen yesterday by Dr. Jolayne Panther and was instructed her HCG levels did not drop enough and that she would need additional lab work and another injection.  Past Medical History:  Diagnosis Date  . Medical history non-contributory     Past Surgical History:  Procedure Laterality Date  . LEG SURGERY      Family History  Problem Relation Age of Onset  . Cancer Mother   . Depression Mother   . Hypertension Mother   . Miscarriages / [place] Mother   . Cancer Father   . Asthma Brother   . Alcohol abuse Maternal Aunt   . Asthma Maternal Aunt   . Depression Maternal Aunt   . Hypertension Maternal Aunt   . Diabetes Maternal Uncle   . Hypertension Maternal Uncle   . Cancer Paternal Aunt   . Asthma Maternal Grandmother   . Diabetes Maternal Grandmother   . Hypertension Maternal Grandmother   . Miscarriages / Stillbirths Maternal Grandmother   . Varicose Veins Maternal Grandmother   . Arthritis Paternal Grandmother   . Cancer Paternal Grandmother   . Diabetes Paternal Grandmother   . Hypertension Paternal Grandmother     Social History  Substance Use Topics  . Smoking status: Former Games developer  . Smokeless tobacco: Never Used  . Alcohol use Yes     Comment: rare    Allergies:  Allergies  Allergen Reactions  . Latex Rash    Prescriptions Prior to Admission  Medication Sig Dispense Refill Last Dose  . metroNIDAZOLE (FLAGYL) 500 MG tablet Take 1 tablet (500 mg total) by mouth 2 (two) times  daily. 14 tablet 0 12/03/2016 at Unknown time    Review of Systems  Constitutional: Negative.   HENT: Negative.   Eyes: Negative.   Respiratory: Negative.   Cardiovascular: Negative.   Gastrointestinal: Positive for abdominal pain (Lower abd pain; L>R), nausea and vomiting.  Endocrine: Negative.   Genitourinary: Negative.   Musculoskeletal: Negative.   Skin: Negative.   Allergic/Immunologic: Negative.   Neurological: Positive for light-headedness (last night).  Hematological: Negative.   Psychiatric/Behavioral: Negative.    Physical Exam   Blood pressure 102/75, pulse 86, temperature 98.7 F (37.1 C), temperature source Oral, resp. rate 16, height 5\' 3"  (1.6 m), weight 51.3 kg (113 lb), last menstrual period 11/16/2016.  Physical Exam  Constitutional: She is oriented to person, place, and time. She appears well-developed and well-nourished.  HENT:  Head: Normocephalic.  Eyes: Pupils are equal, round, and reactive to light.  Neck: Normal range of motion.  GI: Soft. Bowel sounds are normal. There is tenderness (mild LT side tenderness).  Genitourinary:  Genitourinary Comments: Pelvic deferred  Musculoskeletal: Normal range of motion.  Neurological: She is alert and oriented to person, place, and time. She has normal reflexes.  Skin: Skin is warm and dry.  Psychiatric: She has a normal mood and affect. Her behavior is normal. Judgment and thought content normal.  MAU Course  Procedures  MDM CBC CMP AST BUN Creatinine MTX injection #2 OB U/s Transvaginal  Assessment and Plan  Ectopic pregnancy without intrauterine pregnancy, unspecified location - Ectopic pregnancy precautions given - MTX injection #2 while in MAU - Return to [facility] Monday 12/16/16 for rpt HCG - Return to MAU for increased bleeding and/or pain  Discharge home Patient verbalized an understanding of the plan of care and agrees.   Raelyn Moraolitta Dulcy Sida, MSN, CNM 12/13/2016, 12:42 PM

## 2016-12-13 NOTE — Discharge Instructions (Signed)
Ectopic Pregnancy An ectopic pregnancy is when the fertilized egg attaches (implants) outside the uterus. Most ectopic pregnancies occur in one of the tubes where eggs travel from the ovary to the uterus (fallopian tubes), but the implanting can occur in other locations. In rare cases, ectopic pregnancies occur on the ovary, intestine, pelvis, abdomen, or cervix. In an ectopic pregnancy, the fertilized egg does not have the ability to develop into a normal, healthy baby. A ruptured ectopic pregnancy is one in which tearing or bursting of a fallopian tube causes internal bleeding. Often, there is intense lower abdominal pain, and vaginal bleeding sometimes occurs. Having an ectopic pregnancy can be life-threatening. If this dangerous condition is not treated, it can lead to blood loss, shock, or even death. What are the causes? The most common cause of this condition is damage to one of the fallopian tubes. A fallopian tube may be narrowed or blocked, and that keeps the fertilized egg from reaching the uterus. What increases the risk? This condition is more likely to develop in women of childbearing age who have different levels of risk. The levels of risk can be divided into three categories. High risk  You have gone through infertility treatment.  You have had an ectopic pregnancy before.  You have had surgery on the fallopian tubes, or another surgical procedure, such as an abortion.  You have had surgery to have the fallopian tubes tied (tubal ligation).  You have problems or diseases of the fallopian tubes.  You have been exposed to diethylstilbestrol (DES). This medicine was used until 1971, and it had effects on babies whose mothers took the medicine.  You become pregnant while using an IUD (intrauterine device) for birth control. Moderate risk  You have a history of infertility.  You have had an STI (sexually transmitted infection).  You have a history of pelvic inflammatory  disease (PID).  You have scarring from endometriosis.  You have multiple sexual partners.  You smoke. Low risk  You have had pelvic surgery.  You use vaginal douches.  You became sexually active before age 18. What are the signs or symptoms? Common symptoms of this condition include normal pregnancy symptoms, such as missing a period, nausea, tiredness, abdominal pain, breast tenderness, and bleeding. However, ectopic pregnancy will have additional symptoms, such as:  Pain with intercourse.  Irregular vaginal bleeding or spotting.  Cramping or pain on one side or in the lower abdomen.  Fast heartbeat, low blood pressure, and sweating.  Passing out while having a bowel movement.  Symptoms of a ruptured ectopic pregnancy and internal bleeding may include:  Sudden, severe pain in the abdomen and pelvis.  Dizziness, weakness, light-headedness, or fainting.  Pain in the shoulder or neck area.  How is this diagnosed? This condition is diagnosed by:  A pelvic exam to locate pain or a mass in the abdomen.  A pregnancy test. This blood test checks for the presence as well as the specific level of pregnancy hormone in the bloodstream.  Ultrasound. This is performed if a pregnancy test is positive. In this test, a probe is inserted into the vagina. The probe will detect a fetus, possibly in a location other than the uterus.  Taking a sample of uterus tissue (dilation and curettage, or D&C).  Surgery to perform a visual exam of the inside of the abdomen using a thin, lighted tube that has a tiny camera on the end (laparoscope).  Culdocentesis. This procedure involves inserting a needle at the top   of the vagina, behind the uterus. If blood is present in this area, it may indicate that a fallopian tube is torn.  How is this treated? This condition is treated with medicine or surgery. Medicine  An injection of a medicine (methotrexate) may be given to cause the pregnancy tissue  to be absorbed. This medicine may save your fallopian tube. It may be given if: ? The diagnosis is made early, with no signs of active bleeding. ? The fallopian tube has not ruptured. ? You are considered to be a good candidate for the medicine. Usually, pregnancy hormone blood levels are checked after methotrexate treatment. This is to be sure that the medicine is effective. It may take 4-6 weeks for the pregnancy to be absorbed. Most pregnancies will be absorbed by 3 weeks. Surgery  A laparoscope may be used to remove the pregnancy tissue.  If severe internal bleeding occurs, a larger cut (incision) may be made in the lower abdomen (laparotomy) to remove the fetus and placenta. This is done to stop the bleeding.  Part or all of the fallopian tube may be removed (salpingectomy) along with the fetus and placenta. The fallopian tube may also be repaired during the surgery.  In very rare circumstances, removal of the uterus (hysterectomy) may be required.  After surgery, pregnancy hormone testing may be done to be sure that there is no pregnancy tissue left. Whether your treatment is medicine or surgery, you may receive a Rho (D) immune globulin shot to prevent problems with any future pregnancy. This shot may be given if:  You are Rh-negative and the baby's father is Rh-positive.  You are Rh-negative and you do not know the Rh type of the baby's father.  Follow these instructions at home:  Rest and limit your activity after the procedure for as long as told by your health care provider.  Until your health care provider says that it is safe: ? Do not lift anything that is heavier than 10 lb (4.5 kg), or the limit that your health care provider tells you. ? Avoid physical exercise and any movement that requires effort (is strenuous).  To help prevent constipation: ? Eat a healthy diet that includes fruits, vegetables, and whole grains. ? Drink 6-8 glasses of water per day. Get help  right away if:  You develop worsening pain that is not relieved by medicine.  You have: ? A fever or chills. ? Vaginal bleeding. ? Redness and swelling at the incision site. ? Nausea and vomiting.  You feel dizzy or weak.  You feel light-headed or you faint. This information is not intended to replace advice given to you by your health care provider. Make sure you discuss any questions you have with your health care provider. Document Released: 07/18/2004 Document Revised: 02/07/2016 Document Reviewed: 01/10/2016 Elsevier Interactive Patient Education  2018 Elsevier Inc.  

## 2016-12-16 ENCOUNTER — Ambulatory Visit: Payer: Self-pay

## 2016-12-16 DIAGNOSIS — O3680X0 Pregnancy with inconclusive fetal viability, not applicable or unspecified: Secondary | ICD-10-CM

## 2016-12-16 LAB — HCG, QUANTITATIVE, PREGNANCY: HCG, BETA CHAIN, QUANT, S: 138 m[IU]/mL — AB (ref <5)

## 2016-12-16 NOTE — Progress Notes (Signed)
Patient presented to office today for a stat BHCG. Patient reports having some nausea today and bleeding has been a little heavy today. Patient has been advised to wait for results in our office. Per Hilda LiasMarie bhcg numbers are declining. Patient should come back in on 12/19/2016 for another STAT repeat quant.

## 2016-12-20 ENCOUNTER — Inpatient Hospital Stay (HOSPITAL_COMMUNITY)
Admission: AD | Admit: 2016-12-20 | Discharge: 2016-12-20 | Disposition: A | Discharge status: Home / Self Care | Payer: Self-pay | Source: Ambulatory Visit | Attending: Obstetrics & Gynecology | Admitting: Obstetrics & Gynecology

## 2016-12-20 DIAGNOSIS — O0281 Inappropriate change in quantitative human chorionic gonadotropin (hCG) in early pregnancy: Secondary | ICD-10-CM | POA: Insufficient documentation

## 2016-12-20 DIAGNOSIS — Z9221 Personal history of antineoplastic chemotherapy: Secondary | ICD-10-CM

## 2016-12-20 DIAGNOSIS — O26851 Spotting complicating pregnancy, first trimester: Secondary | ICD-10-CM | POA: Insufficient documentation

## 2016-12-20 DIAGNOSIS — O009 Unspecified ectopic pregnancy without intrauterine pregnancy: Secondary | ICD-10-CM

## 2016-12-20 DIAGNOSIS — Z3A01 Less than 8 weeks gestation of pregnancy: Secondary | ICD-10-CM | POA: Insufficient documentation

## 2016-12-20 LAB — HCG, QUANTITATIVE, PREGNANCY: hCG, Beta Chain, Quant, S: 50 m[IU]/mL — ABNORMAL HIGH (ref <5)

## 2016-12-20 NOTE — MAU Note (Signed)
Pt presents to MAU for repeat BHCG. Denies any pain. Reports vaginal bleeding. Dr Macon LargeANyanwu in discussing plan of care with pt. Pt states she will call back for results if she has not heard from the hospital.

## 2016-12-20 NOTE — MAU Provider Note (Signed)
History   Chief Complaint:  Follow up after Methotrexate #2 on 12/12/16  Paula Molina is  31 y.o. G2P1001 Patient's last menstrual period was 11/16/2016.Marland Kitchen Patient is here for follow up of quantitative HCG after second MTX therapy. Since her last visit, the patient is without new complaint.   The patient reports bleeding as  spotting.  No pain.   General ROS:  negative  Her previous Quantitative HCG values are: Results for Paula, Molina (MRN [id number]) as of 12/20/2016 14:07  Ref. Range 12/04/2016 12:07 12/06/2016 09:00 12/09/2016 12:04 12/12/2016 11:35 12/16/2016 11:05  HCG, Beta Chain, Quant, S Latest Ref Range: <5 mIU/mL 229 (H) 238 (H) 241 (H) 228 (H) 138 (H)    Physical Exam  Blood pressure 120/86, pulse 80, temperature 98.6 F (37 C), temperature source Oral, resp. rate 16, last menstrual period 11/16/2016. VS reviewed, nursing note reviewed,  Constitutional: well developed, well nourished, no distress HEENT: normocephalic CV: normal rate Pulm/chest wall: normal effort Abdomen: soft Neuro: alert and oriented x 3 Skin: warm, dry Psych: affect normal  Ultrasound Studies:   US Ob Comp Less 14 Wks  Result Date: 12/02/2016 CLINICAL DATA:  Bleeding since May 26. Left pelvic pain. Quantitative beta HCG 207. EXAM: OBSTETRIC <14 WK [place] AND TRANSVAGINAL OB US DOPPLER ULTRASOUND OF OVARIES TECHNIQUE: Both transabdominal and transvaginal ultrasound examinations were performed for complete evaluation of the gestation as well as the maternal uterus, adnexal regions, and pelvic cul-de-sac. Transvaginal technique was performed to assess early pregnancy. Color and duplex Doppler ultrasound was utilized to evaluate blood flow to the ovaries. COMPARISON:  None. FINDINGS: No intra or extrauterine gestational sac is identified. No extra ovarian mass. No complex or large volume pelvic fluid. The uterus is retroflexed, otherwise unremarkable. A nonvascular mass in the right ovary with internal  lace-like appearance consistent with a hemorrhagic cyst, up to 3.7 cm in diameter. The symptomatic left ovary is normal in size and appearance. Pulsed Doppler evaluation of both ovaries demonstrates normal appearing low-resistance arterial and venous waveforms. IMPRESSION: 1. Pregnancy of unknown location. Differential considerations include intrauterine gestation too early to be sonographically visualized, spontaneous abortion, or ectopic pregnancy. Consider follow-up ultrasound in 10 days and serial quantitative beta HCG follow-up. 2. Negative for ovarian torsion. On the symptomatic left side, the ovary has a normal appearance. 3. 3.7 cm probable hemorrhagic cyst in the right ovary. Electronically Signed   By: Marnee Spring M.D.   On: 12/02/2016 08:46   US Ob Transvaginal  Result Date: 12/13/2016 CLINICAL DATA:  Pregnant, follow-up for viability EXAM: TRANSVAGINAL OB ULTRASOUND TECHNIQUE: Transvaginal ultrasound was performed for complete evaluation of the gestation as well as the maternal uterus, adnexal regions, and pelvic cul-de-sac. COMPARISON:  None. FINDINGS: Intrauterine gestational sac: None Yolk sac:  Not Visualized. Embryo:  Not Visualized. Subchorionic hemorrhage:  None visualized. Maternal uterus/adnexae: Right ovary is notable for a 2.3 x 4.0 x 2.7 cm hemorrhagic cyst. Left ovary is notable for a 4.3 x 2.4 x 4.2 cm hemorrhagic cyst. Moderate pelvic ascites. IMPRESSION: No IUP is visualized. By definition, in the setting of a positive pregnancy test, this reflects a pregnancy of unknown location. Differential considerations include abnormal IUP/missed abortion or nonvisualized ectopic pregnancy (favored). Bilateral hemorrhagic cysts, as described above. Moderate pelvic ascites. Electronically Signed   By: Charline Bills M.D.   On: 12/13/2016 14:08   US Ob Transvaginal  Result Date: 12/04/2016 CLINICAL DATA:  Pregnancy of unknown location, left pelvic pain, bleeding x3 days, beta HCG  229,  previously 207 on 06/11 EXAM: TRANSVAGINAL OB ULTRASOUND TECHNIQUE: Transvaginal ultrasound was performed for complete evaluation of the gestation as well as the maternal uterus, adnexal regions, and pelvic cul-de-sac. COMPARISON:  12/02/2016 FINDINGS: Intrauterine gestational sac: None Yolk sac:  Not Visualized. Embryo:  Not Visualized. Subchorionic hemorrhage:  None visualized. Maternal uterus/adnexae: Endometrial complex measures 7 mm. 3.0 cm right ovarian hemorrhagic cyst. Left ovary is within normal limits. No free fluid. IMPRESSION: No IUP is visualized. By definition, in the setting of a positive pregnancy test, this reflects a pregnancy of unknown location. Differential considerations include abnormal IUP/missed abortion or nonvisualized ectopic pregnancy. Early normal IUP is not considered within the differential given the inappropriate rise in beta HCG. Serial beta HCG is suggested. Consider repeat pelvic ultrasound in 14 days, or earlier as clinically warranted. Electronically Signed   By: Charline BillsSriyesh  Krishnan M.D.   On: 12/04/2016 15:07   [place] Ob Transvaginal  Result Date: 12/02/2016 CLINICAL DATA:  Bleeding since May 26. Left pelvic pain. Quantitative beta HCG 207. EXAM: OBSTETRIC <14 WK US AND TRANSVAGINAL OB US DOPPLER ULTRASOUND OF OVARIES TECHNIQUE: Both transabdominal and transvaginal ultrasound examinations were performed for complete evaluation of the gestation as well as the maternal uterus, adnexal regions, and pelvic cul-de-sac. Transvaginal technique was performed to assess early pregnancy. Color and duplex Doppler ultrasound was utilized to evaluate blood flow to the ovaries. COMPARISON:  None. FINDINGS: No intra or extrauterine gestational sac is identified. No extra ovarian mass. No complex or large volume pelvic fluid. The uterus is retroflexed, otherwise unremarkable. A nonvascular mass in the right ovary with internal lace-like appearance consistent with a hemorrhagic cyst, up to 3.7 cm  in diameter. The symptomatic left ovary is normal in size and appearance. Pulsed Doppler evaluation of both ovaries demonstrates normal appearing low-resistance arterial and venous waveforms. IMPRESSION: 1. Pregnancy of unknown location. Differential considerations include intrauterine gestation too early to be sonographically visualized, spontaneous abortion, or ectopic pregnancy. Consider follow-up ultrasound in 10 days and serial quantitative beta HCG follow-up. 2. Negative for ovarian torsion. On the symptomatic left side, the ovary has a normal appearance. 3. 3.7 cm probable hemorrhagic cyst in the right ovary. Electronically Signed   By: Marnee SpringJonathon  Watts M.D.   On: 12/02/2016 08:46   [place] Art/ven Flow Abd Pelv Doppler  Result Date: 12/02/2016 CLINICAL DATA:  Bleeding since May 26. Left pelvic pain. Quantitative beta HCG 207. EXAM: OBSTETRIC <14 WK US AND TRANSVAGINAL OB US DOPPLER ULTRASOUND OF OVARIES TECHNIQUE: Both transabdominal and transvaginal ultrasound examinations were performed for complete evaluation of the gestation as well as the maternal uterus, adnexal regions, and pelvic cul-de-sac. Transvaginal technique was performed to assess early pregnancy. Color and duplex Doppler ultrasound was utilized to evaluate blood flow to the ovaries. COMPARISON:  None. FINDINGS: No intra or extrauterine gestational sac is identified. No extra ovarian mass. No complex or large volume pelvic fluid. The uterus is retroflexed, otherwise unremarkable. A nonvascular mass in the right ovary with internal lace-like appearance consistent with a hemorrhagic cyst, up to 3.7 cm in diameter. The symptomatic left ovary is normal in size and appearance. Pulsed Doppler evaluation of both ovaries demonstrates normal appearing low-resistance arterial and venous waveforms. IMPRESSION: 1. Pregnancy of unknown location. Differential considerations include intrauterine gestation too early to be sonographically visualized, spontaneous  abortion, or ectopic pregnancy. Consider follow-up ultrasound in 10 days and serial quantitative beta HCG follow-up. 2. Negative for ovarian torsion. On the symptomatic left side, the ovary has a  normal appearance. 3. 3.7 cm probable hemorrhagic cyst in the right ovary. Electronically Signed   By: Marnee Spring M.D.   On: 12/02/2016 08:46   Results for orders placed or performed during the hospital encounter of 12/20/16 (from the past 24 hour(s))  hCG, quantitative, pregnancy     Status: Abnormal   Collection Time: 12/20/16  1:54 PM  Result Value Ref Range   hCG, Beta Chain, Quant, S 50 (H) <5 mIU/mL    Assessment: Methotrexate follow up  Plan: Great drop in HCG from 138 to 50 Needs weekly HCG draws in [facility] until negative Patient informed of need for close surveillance Pain precautions reviewed  Jaynie Collins, MD, FACOG Attending Obstetrician & Gynecologist, [facility] for [facility], [facility]

## 2016-12-20 NOTE — Progress Notes (Signed)
RN talked to pt on phone and told her to follow up on Thursday or Friday in the clinic. Pt understood and had not questions.

## 2016-12-27 ENCOUNTER — Other Ambulatory Visit: Payer: Self-pay

## 2017-01-01 ENCOUNTER — Ambulatory Visit: Payer: Self-pay | Admitting: Advanced Practice Midwife

## 2017-07-12 ENCOUNTER — Encounter (HOSPITAL_COMMUNITY): Payer: Self-pay

## 2017-07-12 ENCOUNTER — Ambulatory Visit (HOSPITAL_COMMUNITY)
Admission: EM | Admit: 2017-07-12 | Discharge: 2017-07-12 | Disposition: A | Discharge status: Other Acute Inpatient Hospital | Payer: Self-pay

## 2017-07-12 ENCOUNTER — Inpatient Hospital Stay (HOSPITAL_COMMUNITY)
Admission: AD | Admit: 2017-07-12 | Discharge: 2017-07-12 | Discharge status: Against Medical Advice | Payer: Self-pay | Source: Ambulatory Visit | Attending: Obstetrics and Gynecology | Admitting: Obstetrics and Gynecology

## 2017-07-12 DIAGNOSIS — Z3202 Encounter for pregnancy test, result negative: Secondary | ICD-10-CM | POA: Insufficient documentation

## 2017-07-12 DIAGNOSIS — F17200 Nicotine dependence, unspecified, uncomplicated: Secondary | ICD-10-CM | POA: Insufficient documentation

## 2017-07-12 DIAGNOSIS — N939 Abnormal uterine and vaginal bleeding, unspecified: Secondary | ICD-10-CM | POA: Insufficient documentation

## 2017-07-12 DIAGNOSIS — Z5321 Procedure and treatment not carried out due to patient leaving prior to being seen by health care provider: Secondary | ICD-10-CM | POA: Insufficient documentation

## 2017-07-12 DIAGNOSIS — Z91040 Latex allergy status: Secondary | ICD-10-CM | POA: Insufficient documentation

## 2017-07-12 DIAGNOSIS — Z7982 Long term (current) use of aspirin: Secondary | ICD-10-CM | POA: Insufficient documentation

## 2017-07-12 LAB — URINALYSIS, ROUTINE W REFLEX MICROSCOPIC
BACTERIA UA: NONE SEEN
BILIRUBIN URINE: NEGATIVE
Glucose, UA: NEGATIVE mg/dL
Ketones, ur: NEGATIVE mg/dL
NITRITE: NEGATIVE
PH: 6 (ref 5.0–8.0)
Protein, ur: 30 mg/dL — AB
SPECIFIC GRAVITY, URINE: 1.023 (ref 1.005–1.030)

## 2017-07-12 LAB — CBC
HEMATOCRIT: 42.3 % (ref 36.0–46.0)
HEMOGLOBIN: 14.1 g/dL (ref 12.0–15.0)
MCH: 33.6 pg (ref 26.0–34.0)
MCHC: 33.3 g/dL (ref 30.0–36.0)
MCV: 100.7 fL — AB (ref 78.0–100.0)
Platelets: 207 10*3/uL (ref 150–400)
RBC: 4.2 MIL/uL (ref 3.87–5.11)
RDW: 15.4 % (ref 11.5–15.5)
WBC: 4.6 10*3/uL (ref 4.0–10.5)

## 2017-07-12 LAB — HCG, QUANTITATIVE, PREGNANCY: hCG, Beta Chain, Quant, S: <1 m[IU]/mL (ref <5)

## 2017-07-12 LAB — POCT PREGNANCY, URINE: PREG TEST UR: NEGATIVE

## 2017-07-12 NOTE — MAU Provider Note (Signed)
History     CSN: [id number]  Arrival date and time: 07/12/17 1324   First Provider Initiated Contact with Patient 07/12/17 1417      Chief Complaint  Patient presents with  . Vaginal Bleeding   HPI Paula Molina 32 y.o.  Comes to MAU with vaginal bleeding.  LMP was 06-14-17.  Had a pregnancy test that ws faintly positive this week and is now having vaginal bleeding.  Thinks she is pregnant.  Is tearful and wants to know what is going on.  Urine pregnancy test is negative.  Will do blood work.  OB History    Gravida Para Term Preterm AB Living   2 1 1   1 1    SAB TAB Ectopic Multiple Live Births       1   1      Past Medical History:  Diagnosis Date  . Medical history non-contributory     Past Surgical History:  Procedure Laterality Date  . LEG SURGERY      Family History  Problem Relation Age of Onset  . Cancer Mother   . Depression Mother   . Hypertension Mother   . Miscarriages / [place] Mother   . Cancer Father   . Asthma Brother   . Alcohol abuse Maternal Aunt   . Asthma Maternal Aunt   . Depression Maternal Aunt   . Hypertension Maternal Aunt   . Diabetes Maternal Uncle   . Hypertension Maternal Uncle   . Cancer Paternal Aunt   . Asthma Maternal Grandmother   . Diabetes Maternal Grandmother   . Hypertension Maternal Grandmother   . Miscarriages / Stillbirths Maternal Grandmother   . Varicose Veins Maternal Grandmother   . Arthritis Paternal Grandmother   . Cancer Paternal Grandmother   . Diabetes Paternal Grandmother   . Hypertension Paternal Grandmother     Social History   Tobacco Use  . Smoking status: Current Some Day Smoker  . Smokeless tobacco: Former Neurosurgeon  . Tobacco comment: i cig   Substance Use Topics  . Alcohol use: Yes    Comment: rare  . Drug use: Yes    Types: Marijuana    Allergies:  Allergies  Allergen Reactions  . Latex Rash    Medications Prior to Admission  Medication Sig Dispense Refill Last Dose   . Aspirin-Acetaminophen-Caffeine (GOODY HEADACHE PO) Take 1 each by mouth daily as needed (For pain.).   07/11/2017 at Unknown time    Review of Systems  Constitutional: Negative for fever.  Gastrointestinal: Positive for abdominal pain. Negative for nausea and vomiting.  Genitourinary: Positive for vaginal bleeding. Negative for dysuria and vaginal discharge.   Physical Exam   Blood pressure (!) 148/97, pulse 94, temperature 98 F (36.7 C), temperature source Oral, resp. rate 18, height 5\' 4"  (1.626 m), weight 123 lb 12 oz (56.1 kg), last menstrual period 06/14/2017, unknown if currently breastfeeding.  Physical Exam  Nursing note and vitals reviewed. Constitutional: She is oriented to person, place, and time. She appears well-developed and well-nourished.  HENT:  Head: Normocephalic.  Eyes: EOM are normal.  Neck: Neck supple.  Respiratory: Effort normal.  Musculoskeletal: Normal range of motion.  Neurological: She is alert and oriented to person, place, and time.  Skin: Skin is warm and dry.  Psychiatric: She has a normal mood and affect.    MAU Course  Procedures Results for orders placed or performed during the hospital encounter of 07/12/17 (from the past 24 hour(s))  Urinalysis, Routine  w reflex microscopic     Status: Abnormal   Collection Time: 07/12/17  1:33 PM  Result Value Ref Range   Color, Urine YELLOW YELLOW   APPearance HAZY (A) CLEAR   Specific Gravity, Urine 1.023 1.005 - 1.030   pH 6.0 5.0 - 8.0   Glucose, UA NEGATIVE NEGATIVE mg/dL   Hgb urine dipstick LARGE (A) NEGATIVE   Bilirubin Urine NEGATIVE NEGATIVE   Ketones, ur NEGATIVE NEGATIVE mg/dL   Protein, ur 30 (A) NEGATIVE mg/dL   Nitrite NEGATIVE NEGATIVE   Leukocytes, UA SMALL (A) NEGATIVE   RBC / HPF TOO NUMEROUS TO COUNT 0 - 5 RBC/hpf   WBC, UA TOO NUMEROUS TO COUNT 0 - 5 WBC/hpf   Bacteria, UA NONE SEEN NONE SEEN   Squamous Epithelial / LPF 6-30 (A) NONE SEEN   Mucus PRESENT   Pregnancy, urine  POC     Status: None   Collection Time: 07/12/17  2:09 PM  Result Value Ref Range   Preg Test, Ur NEGATIVE NEGATIVE  CBC     Status: Abnormal   Collection Time: 07/12/17  2:33 PM  Result Value Ref Range   WBC 4.6 4.0 - 10.5 K/uL   RBC 4.20 3.87 - 5.11 MIL/uL   Hemoglobin 14.1 12.0 - 15.0 g/dL   HCT 69.642.3 29.536.0 - 28.446.0 %   MCV 100.7 (H) 78.0 - 100.0 fL   MCH 33.6 26.0 - 34.0 pg   MCHC 33.3 30.0 - 36.0 g/dL   RDW 13.215.4 44.011.5 - 10.215.5 %   Platelets 207 150 - 400 K/uL  hCG, quantitative, pregnancy     Status: None   Collection Time: 07/12/17  2:33 PM  Result Value Ref Range   hCG, Beta Chain, Quant, S <1 <5 mIU/mL    MDM Will draw labs first.   Client has to leave AMA as her partner and her child have appointments.  Assessment and Plan  Left AMA Client is not pregnant by her lab work.    [employer] L Dylynn Ketner 07/12/2017, 2:33 PM

## 2017-07-12 NOTE — MAU Note (Addendum)
Reports pos HPT last week, started bleeding/having brown discharge on the 17th. Passes small clots since yesterday. Cramping on the 17th and 18th. States she had ectopic preg in June and it doesn't feel like that.

## 2017-11-08 ENCOUNTER — Encounter: Payer: Self-pay | Admitting: Emergency Medicine

## 2017-11-08 ENCOUNTER — Emergency Department: Payer: Self-pay

## 2017-11-08 ENCOUNTER — Other Ambulatory Visit: Payer: Self-pay

## 2017-11-08 DIAGNOSIS — R1031 Right lower quadrant pain: Secondary | ICD-10-CM | POA: Insufficient documentation

## 2017-11-08 DIAGNOSIS — N3000 Acute cystitis without hematuria: Secondary | ICD-10-CM | POA: Insufficient documentation

## 2017-11-08 LAB — CBC WITH DIFFERENTIAL/PLATELET
BASOS PCT: 1 %
Basophils Absolute: 0 10*3/uL (ref 0–0.1)
EOS ABS: 0 10*3/uL (ref 0–0.7)
Eosinophils Relative: 1 %
HCT: 38.6 % (ref 35.0–47.0)
HEMOGLOBIN: 13.5 g/dL (ref 12.0–16.0)
Lymphocytes Relative: 21 %
Lymphs Abs: 1.6 10*3/uL (ref 1.0–3.6)
MCH: 34.6 pg — ABNORMAL HIGH (ref 26.0–34.0)
MCHC: 35 g/dL (ref 32.0–36.0)
MCV: 98.9 fL (ref 80.0–100.0)
MONOS PCT: 10 %
Monocytes Absolute: 0.7 10*3/uL (ref 0.2–0.9)
NEUTROS PCT: 67 %
Neutro Abs: 5.1 10*3/uL (ref 1.4–6.5)
Platelets: 191 10*3/uL (ref 150–440)
RBC: 3.9 MIL/uL (ref 3.80–5.20)
RDW: 14.2 % (ref 11.5–14.5)
WBC: 7.5 10*3/uL (ref 3.6–11.0)

## 2017-11-08 LAB — URINALYSIS, ROUTINE W REFLEX MICROSCOPIC
Bilirubin Urine: NEGATIVE
GLUCOSE, UA: NEGATIVE mg/dL
KETONES UR: NEGATIVE mg/dL
Nitrite: POSITIVE — AB
PROTEIN: 100 mg/dL — AB
RBC / HPF: >50 RBC/hpf — ABNORMAL HIGH (ref 0–5)
SQUAMOUS EPITHELIAL / LPF: NONE SEEN (ref 0–5)
Specific Gravity, Urine: 1.011 (ref 1.005–1.030)
WBC, UA: >50 WBC/hpf — ABNORMAL HIGH (ref 0–5)
pH: 5 (ref 5.0–8.0)

## 2017-11-08 LAB — BASIC METABOLIC PANEL
Anion gap: 7 (ref 5–15)
BUN: 15 mg/dL (ref 6–20)
CALCIUM: 9.2 mg/dL (ref 8.9–10.3)
CO2: 25 mmol/L (ref 22–32)
CREATININE: 0.73 mg/dL (ref 0.44–1.00)
Chloride: 104 mmol/L (ref 101–111)
Glucose, Bld: 106 mg/dL — ABNORMAL HIGH (ref 65–99)
Potassium: 4 mmol/L (ref 3.5–5.1)
SODIUM: 136 mmol/L (ref 135–145)

## 2017-11-08 LAB — POCT PREGNANCY, URINE: PREG TEST UR: NEGATIVE

## 2017-11-08 LAB — LIPASE, BLOOD: Lipase: 34 U/L (ref 11–51)

## 2017-11-08 NOTE — ED Triage Notes (Signed)
Pt arrives POV to triage with c/o RLQ pain which started suddenly x 1 hour ago. Pt is moaning in pain at this time.

## 2017-11-08 NOTE — ED Notes (Signed)
Pt reports sudden onset of right sided abd pain that "is getting worse by the second"; "I think it's my appendix";

## 2017-11-09 ENCOUNTER — Emergency Department: Payer: Self-pay

## 2017-11-09 ENCOUNTER — Emergency Department
Admission: EM | Admit: 2017-11-09 | Discharge: 2017-11-09 | Disposition: A | Discharge status: Home / Self Care | Payer: Self-pay | Attending: Emergency Medicine | Admitting: Emergency Medicine

## 2017-11-09 DIAGNOSIS — R1031 Right lower quadrant pain: Secondary | ICD-10-CM

## 2017-11-09 DIAGNOSIS — N3090 Cystitis, unspecified without hematuria: Secondary | ICD-10-CM

## 2017-11-09 MED ORDER — CEPHALEXIN 500 MG PO CAPS
500.0000 mg | ORAL_CAPSULE | Freq: Four times a day (QID) | ORAL | 0 refills | Status: AC
Start: 2017-11-09 — End: 2017-11-19

## 2017-11-09 MED ORDER — TRAMADOL HCL 50 MG PO TABS: 50.0000 mg | ORAL_TABLET | Freq: Four times a day (QID) | ORAL | 0 refills | Status: AC | PRN

## 2017-11-09 MED ORDER — ONDANSETRON 4 MG PO TBDP: 4.0000 mg | ORAL_TABLET | Freq: Three times a day (TID) | ORAL | 0 refills | Status: DC | PRN

## 2017-11-09 MED ORDER — KETOROLAC TROMETHAMINE 30 MG/ML IJ SOLN
30.0000 mg | Freq: Once | INTRAMUSCULAR | Status: AC
  Administered 2017-11-09: 30 mg via INTRAVENOUS
  Filled 2017-11-09: qty 1

## 2017-11-09 MED ORDER — MORPHINE SULFATE (PF) 4 MG/ML IV SOLN
4.0000 mg | Freq: Once | INTRAVENOUS | Status: AC
  Administered 2017-11-09: 4 mg via INTRAVENOUS
  Filled 2017-11-09: qty 1

## 2017-11-09 MED ORDER — SODIUM CHLORIDE 0.9 % IV BOLUS
1000.0000 mL | Freq: Once | INTRAVENOUS | Status: AC
  Administered 2017-11-09: 1000 mL via INTRAVENOUS

## 2017-11-09 MED ORDER — ONDANSETRON HCL 4 MG/2ML IJ SOLN
4.0000 mg | Freq: Once | INTRAMUSCULAR | Status: AC
  Administered 2017-11-09: 4 mg via INTRAVENOUS
  Filled 2017-11-09: qty 2

## 2017-11-09 MED ORDER — SODIUM CHLORIDE 0.9 % IV SOLN
1.0000 g | Freq: Once | INTRAVENOUS | Status: AC
  Administered 2017-11-09: 1 g via INTRAVENOUS
  Filled 2017-11-09: qty 10

## 2017-11-09 NOTE — ED Provider Notes (Signed)
[facility] Emergency Department Provider Note   ____________________________________________   First MD Initiated Contact with Patient 11/09/17 0117     (approximate)  I have reviewed the triage vital signs and the nursing notes.   HISTORY  Chief Complaint Pelvic Pain    HPI Paula Molina is a 32 y.o. female who comes into the hospital today with some right-sided abdominal pain.  The patient states that it started suddenly earlier tonight.  The pain has gotten worse and worse until the patient was screaming.  She states that she has had some vomiting with chills and sweats.  She took her temperature and it was 100.  The patient rates her pain a 7 out of 10 in intensity.  Although the pain is in her right lower quadrant she states is going up into her back.  She tried to sleep on it but it did not get any better.  The patient has never had these symptoms before.  She denies any pain with urination or blood in her urine but she does have some pressure and urgency.  She is here for evaluation.   Past Medical History:  Diagnosis Date  . Medical history non-contributory     There are no active problems to display for this patient.   Past Surgical History:  Procedure Laterality Date  . LEG SURGERY      Prior to Admission medications   Medication Sig Start Date End Date Taking? Authorizing Provider  Aspirin-Acetaminophen-Caffeine (GOODY HEADACHE PO) Take 1 each by mouth daily as needed (For pain.).    [provider]  cephALEXin (KEFLEX) 500 MG capsule Take 1 capsule (500 mg total) by mouth 4 (four) times daily for 10 days. 11/09/17 11/19/17  Rebecka Apley, MD  ondansetron (ZOFRAN ODT) 4 MG disintegrating tablet Take 1 tablet (4 mg total) by mouth every 8 (eight) hours as needed for nausea or vomiting. 11/09/17   Rebecka Apley, MD  traMADol (ULTRAM) 50 MG tablet Take 1 tablet (50 mg total) by mouth every 6 (six) hours as needed.  11/09/17   Rebecka Apley, MD    Allergies Latex  Family History  Problem Relation Age of Onset  . Cancer Mother   . Depression Mother   . Hypertension Mother   . Miscarriages / [place] Mother   . Cancer Father   . Asthma Brother   . Alcohol abuse Maternal Aunt   . Asthma Maternal Aunt   . Depression Maternal Aunt   . Hypertension Maternal Aunt   . Diabetes Maternal Uncle   . Hypertension Maternal Uncle   . Cancer Paternal Aunt   . Asthma Maternal Grandmother   . Diabetes Maternal Grandmother   . Hypertension Maternal Grandmother   . Miscarriages / Stillbirths Maternal Grandmother   . Varicose Veins Maternal Grandmother   . Arthritis Paternal Grandmother   . Cancer Paternal Grandmother   . Diabetes Paternal Grandmother   . Hypertension Paternal Grandmother     Social History Social History   Tobacco Use  . Smoking status: Current Some Day Smoker  . Smokeless tobacco: Former Neurosurgeon  . Tobacco comment: i cig   Substance Use Topics  . Alcohol use: Yes    Comment: rare  . Drug use: Yes    Types: Marijuana    Review of Systems  Constitutional: No fever/chills Eyes: No visual changes. ENT: No sore throat. Cardiovascular: Denies chest pain. Respiratory: Denies shortness of breath. Gastrointestinal:  abdominal pain.  nausea,  vomiting.  No diarrhea.  No constipation. Genitourinary: Urgency and frequency Musculoskeletal:  back pain. Skin: Negative for rash. Neurological: Negative for headaches, focal weakness or numbness.   ____________________________________________   PHYSICAL EXAM:  VITAL SIGNS: ED Triage Vitals [11/08/17 2210]  Enc Vitals Group     BP (!) 143/102     Pulse Rate (!) 108     Resp 18     Temp 99.4 F (37.4 C)     Temp Source Oral     SpO2 100 %     Weight 125 lb (56.7 kg)     Height  (1.626 m)     Head Circumference      Peak Flow      Pain Score 10     Pain Loc      Pain Edu?      Excl. in GC?      Constitutional: Alert and oriented. Well appearing and in moderate distress. Eyes: Conjunctivae are normal. PERRL. EOMI. Head: Atraumatic. Nose: No congestion/rhinnorhea. Mouth/Throat: Mucous membranes are moist.  Oropharynx non-erythematous. Cardiovascular: Normal rate, regular rhythm. Grossly normal heart sounds.  Good peripheral circulation. Respiratory: Normal respiratory effort.  No retractions. Lungs CTAB. Gastrointestinal: Soft with some right lower quadrant tenderness to palpation. No distention.  Positive bowel sounds no CVA tenderness to palpation Musculoskeletal: No lower extremity tenderness nor edema.   Neurologic:  Normal speech and language.  Skin:  Skin is warm, dry and intact.  Psychiatric: Mood and affect are normal.   ____________________________________________   LABS (all labs ordered are listed, but only abnormal results are displayed)  Labs Reviewed  URINALYSIS, ROUTINE W REFLEX MICROSCOPIC - Abnormal; Notable for the following components:      Result Value   Color, Urine YELLOW (*)    APPearance CLOUDY (*)    Hgb urine dipstick LARGE (*)    Protein, ur 100 (*)    Nitrite POSITIVE (*)    Leukocytes, UA SMALL (*)    RBC / HPF >50 (*)    WBC, UA >50 (*)    Bacteria, UA RARE (*)    All other components within normal limits  CBC WITH DIFFERENTIAL/PLATELET - Abnormal; Notable for the following components:   MCH 34.6 (*)    All other components within normal limits  BASIC METABOLIC PANEL - Abnormal; Notable for the following components:   Glucose, Bld 106 (*)    All other components within normal limits  URINE CULTURE  LIPASE, BLOOD  POC URINE PREG, ED  POCT PREGNANCY, URINE   ____________________________________________  EKG  None ____________________________________________  RADIOLOGY  ED MD interpretation: Pelvic ultrasound: Unremarkable pelvic ultrasound  CT renal stone study: Punctate nonobstructing right-sided renal calculi, no  obstructive uropathy, normal appendix, no bowel obstruction or inflammation.  Official radiology report(s): US Pelvis Transvanginal Non-ob (tv Only)  Result Date: 11/08/2017 CLINICAL DATA:  Right lower quadrant pain EXAM: TRANSABDOMINAL AND TRANSVAGINAL ULTRASOUND OF PELVIS DOPPLER ULTRASOUND OF OVARIES TECHNIQUE: Both transabdominal and transvaginal ultrasound examinations of the pelvis were performed. Transabdominal technique was performed for global imaging of the pelvis including uterus, ovaries, adnexal regions, and pelvic cul-de-sac. It was necessary to proceed with endovaginal exam following the transabdominal exam to visualize the uterus, endometrium, ovaries and adnexa. Color and duplex Doppler ultrasound was utilized to evaluate blood flow to the ovaries. COMPARISON:  None. FINDINGS: Uterus Measurements: 6.3 x 4.1 x 5.1 cm, retroflexed. No fibroids or other mass visualized. Endometrium Thickness: 9 mm in thickness.  No  focal abnormality visualized. Right ovary Measurements: 3.9 x 2.0 x 2.3 cm. Normal appearance/no adnexal mass. Left ovary Measurements: 3.3 x 2.1 x 2.2 cm. Normal appearance/no adnexal mass. Pulsed Doppler evaluation of both ovaries demonstrates normal low-resistance arterial and venous waveforms. Other findings Trace free fluid. IMPRESSION: Unremarkable pelvic ultrasound. Electronically Signed   By: Charlett Nose M.D.   On: 11/08/2017 23:23   US Pelvis (transabdominal Only)  Result Date: 11/08/2017 CLINICAL DATA:  Right lower quadrant pain EXAM: TRANSABDOMINAL AND TRANSVAGINAL ULTRASOUND OF PELVIS DOPPLER ULTRASOUND OF OVARIES TECHNIQUE: Both transabdominal and transvaginal ultrasound examinations of the pelvis were performed. Transabdominal technique was performed for global imaging of the pelvis including uterus, ovaries, adnexal regions, and pelvic cul-de-sac. It was necessary to proceed with endovaginal exam following the transabdominal exam to visualize the uterus, endometrium,  ovaries and adnexa. Color and duplex Doppler ultrasound was utilized to evaluate blood flow to the ovaries. COMPARISON:  None. FINDINGS: Uterus Measurements: 6.3 x 4.1 x 5.1 cm, retroflexed. No fibroids or other mass visualized. Endometrium Thickness: 9 mm in thickness.  No focal abnormality visualized. Right ovary Measurements: 3.9 x 2.0 x 2.3 cm. Normal appearance/no adnexal mass. Left ovary Measurements: 3.3 x 2.1 x 2.2 cm. Normal appearance/no adnexal mass. Pulsed Doppler evaluation of both ovaries demonstrates normal low-resistance arterial and venous waveforms. Other findings Trace free fluid. IMPRESSION: Unremarkable pelvic ultrasound. Electronically Signed   By: Charlett Nose M.D.   On: 11/08/2017 23:23   US Pelvic Doppler (torsion R/o Or Mass Arterial Flow)  Result Date: 11/08/2017 CLINICAL DATA:  Right lower quadrant pain EXAM: TRANSABDOMINAL AND TRANSVAGINAL ULTRASOUND OF PELVIS DOPPLER ULTRASOUND OF OVARIES TECHNIQUE: Both transabdominal and transvaginal ultrasound examinations of the pelvis were performed. Transabdominal technique was performed for global imaging of the pelvis including uterus, ovaries, adnexal regions, and pelvic cul-de-sac. It was necessary to proceed with endovaginal exam following the transabdominal exam to visualize the uterus, endometrium, ovaries and adnexa. Color and duplex Doppler ultrasound was utilized to evaluate blood flow to the ovaries. COMPARISON:  None. FINDINGS: Uterus Measurements: 6.3 x 4.1 x 5.1 cm, retroflexed. No fibroids or other mass visualized. Endometrium Thickness: 9 mm in thickness.  No focal abnormality visualized. Right ovary Measurements: 3.9 x 2.0 x 2.3 cm. Normal appearance/no adnexal mass. Left ovary Measurements: 3.3 x 2.1 x 2.2 cm. Normal appearance/no adnexal mass. Pulsed Doppler evaluation of both ovaries demonstrates normal low-resistance arterial and venous waveforms. Other findings Trace free fluid. IMPRESSION: Unremarkable pelvic ultrasound.  Electronically Signed   By: Charlett Nose M.D.   On: 11/08/2017 23:23   Ct Renal Stone Study  Result Date: 11/09/2017 CLINICAL DATA:  Sudden onset right-sided abdominal pain. EXAM: CT ABDOMEN AND PELVIS WITHOUT CONTRAST TECHNIQUE: Multidetector CT imaging of the abdomen and pelvis was performed following the standard protocol without IV contrast. COMPARISON:  None. FINDINGS: Lower chest: The included heart is unremarkable.  Clear lung bases. Hepatobiliary: No focal liver abnormality is seen. No gallstones, gallbladder wall thickening, or biliary dilatation. Pancreas: Unremarkable. No pancreatic ductal dilatation or surrounding inflammatory changes. Spleen: Normal in size without focal abnormality. Adrenals/Urinary Tract: Punctate nonobstructing renal calculi noted without ureteral calculus, perinephric fat stranding nor hydroureteronephrosis. Left kidney is unremarkable with possible minimal nephrocalcinosis accounting for faint densities at the cortical-medullary junction. The urinary bladder is nondistended and free of calculi. Stomach/Bowel: Normal appendix visualized. No bowel obstruction or inflammation. Stomach is decompressed. Vascular/Lymphatic: No significant vascular findings are present. No enlarged abdominal or pelvic lymph nodes. Reproductive: Uterus and bilateral  adnexa are unremarkable. Other: No abdominal wall hernia or abnormality. No abdominopelvic ascites. Musculoskeletal: No acute or significant osseous findings. IMPRESSION: 1. Punctate nonobstructing right-sided renal calculi. 2. No obstructive uropathy. 3. Normal appendix.  No bowel obstruction or inflammation. Electronically Signed   By: Tollie Eth M.D.   On: 11/09/2017 02:49    ____________________________________________   PROCEDURES  Procedure(s) performed: None  Procedures  Critical Care performed: No  ____________________________________________   INITIAL IMPRESSION / ASSESSMENT AND PLAN / ED COURSE  As part of my  medical decision making, I reviewed the following data within the electronic MEDICAL RECORD NUMBER Notes from prior ED visits and [place] Controlled Substance Database   This is a 32 year old female who comes into the hospital today with right-sided abdominal pains.  The patient had some blood work drawn and while her CBC and BMP were unremarkable the patient was found to have urinary tract infection with positive nitrites RBCs and white blood cells.  I did send the patient for a CT scan looking for kidney stone or possible appendicitis given the location of her pain and the CT scan was negative.  I did give the patient a liter of normal saline, morphine, Zofran, Toradol and some ceftriaxone.  She will be discharged home to follow-up with the acute care clinic for further evaluation and treatment of her symptoms.      ____________________________________________   FINAL CLINICAL IMPRESSION(S) / ED DIAGNOSES  Final diagnoses:  RLQ abdominal pain  Cystitis     ED Discharge Orders        Ordered    ondansetron (ZOFRAN ODT) 4 MG disintegrating tablet  Every 8 hours PRN     11/09/17 0417    traMADol (ULTRAM) 50 MG tablet  Every 6 hours PRN     11/09/17 0417    cephALEXin (KEFLEX) 500 MG capsule  4 times daily     11/09/17 0417       Note:  This document was prepared using Dragon voice recognition software and may include unintentional dictation errors.    Rebecka Apley, MD 11/09/17 [phone]

## 2017-11-09 NOTE — ED Notes (Signed)
Pt ambulatory from wheelchair to stat registration to check on wait time; discussed same with pt; pt states "please make a notation in my chart that I will not be conscious much longer";

## 2017-11-09 NOTE — ED Notes (Signed)
Pt states she has a heaviness fullness even after urinating. Pt states she has not been experiencing and burning or stinging. Pt states pain in the lower pelvis that has started moving up into the midback

## 2017-11-09 NOTE — Discharge Instructions (Addendum)
Please follow up with your primary care physician for further evaluation of your symptoms. Please return with any other concerns.

## 2017-11-11 LAB — URINE CULTURE: Culture: >=100000 — AB

## 2019-04-01 IMAGING — US US OB TRANSVAGINAL
1 series · 15 of 28 positions shown · non-contrast
Comparison: None.

CLINICAL DATA: Pregnant, follow-up for viability

EXAM:
TRANSVAGINAL OB ULTRASOUND
TECHNIQUE: Transvaginal ultrasound was performed for complete evaluation of the
gestation as well as the maternal uterus, adnexal regions, and
pelvic cul-de-sac.

[Series 1: us ob transvaginal · 15 of 33 slices shown]
[im 1/33]
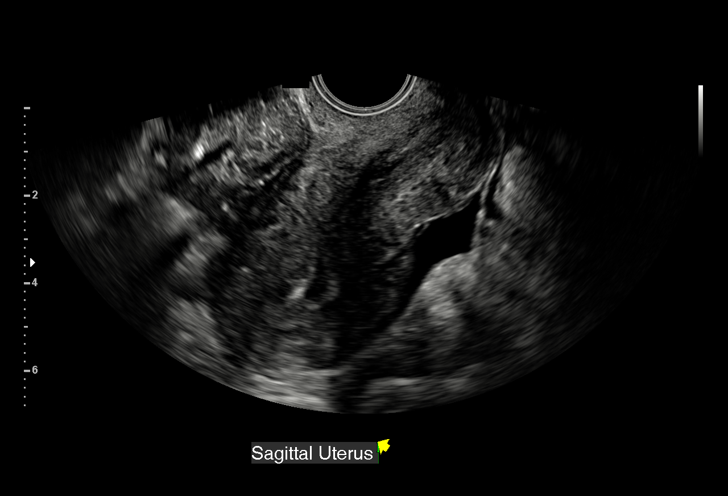
[im 3/33]
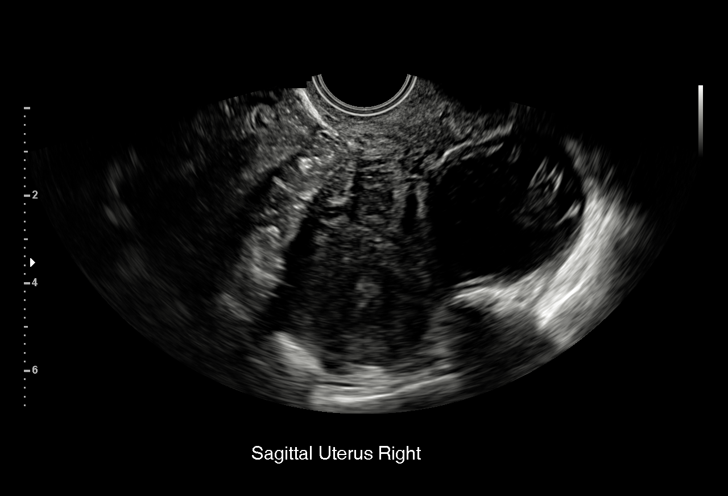
[im 5/33]
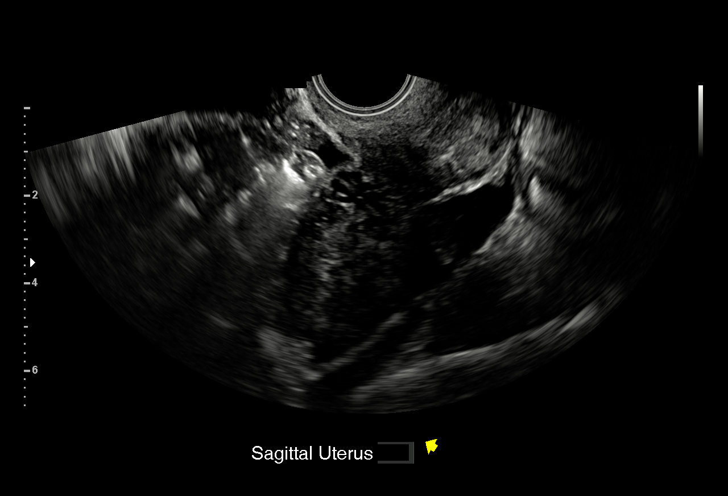
[im 8/33]
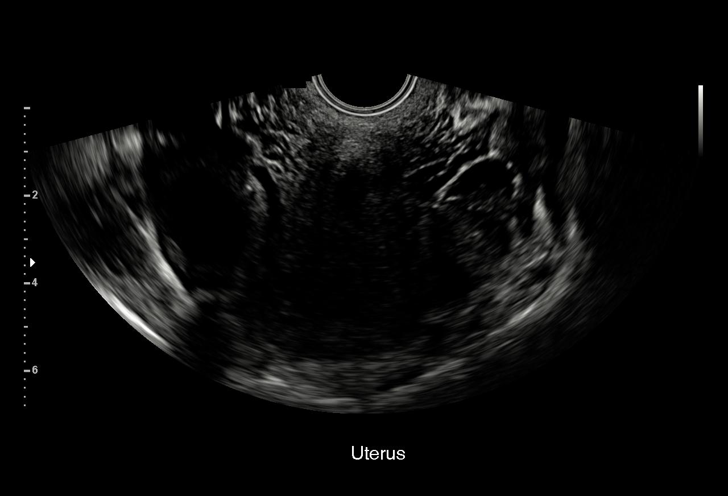
[im 10/33]
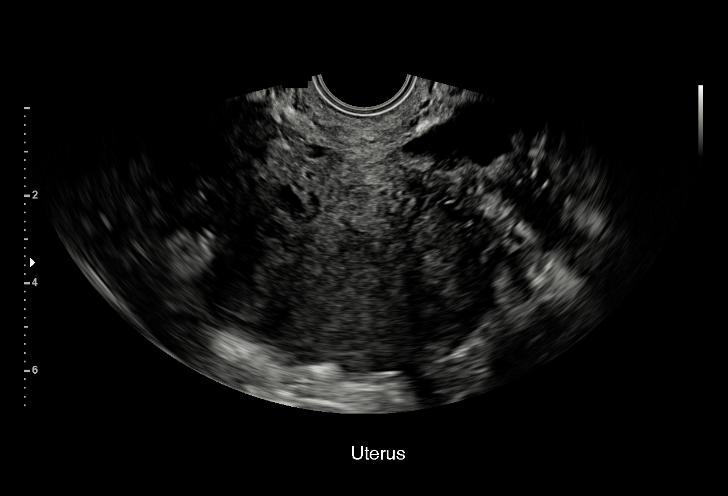
[im 12/33]
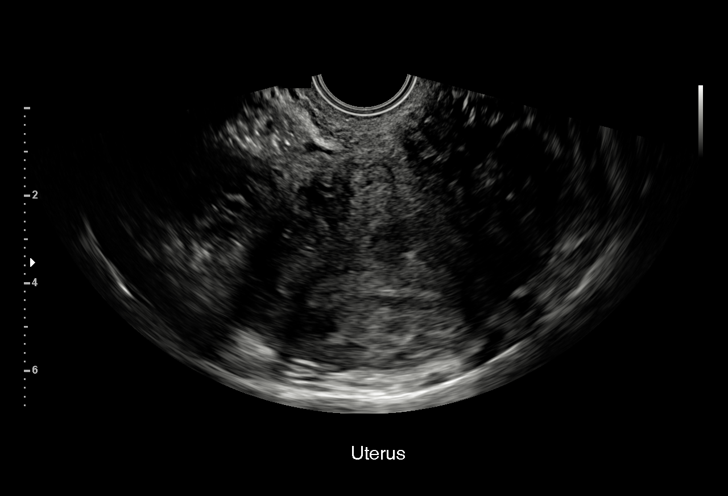
[im 15/33]
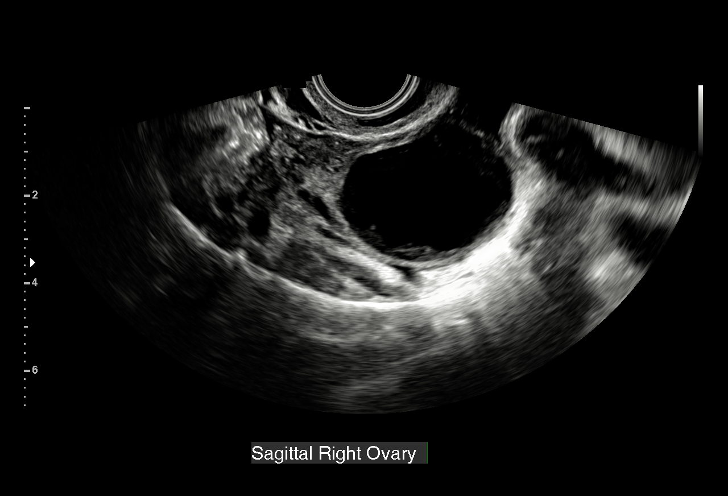
[im 17/33]
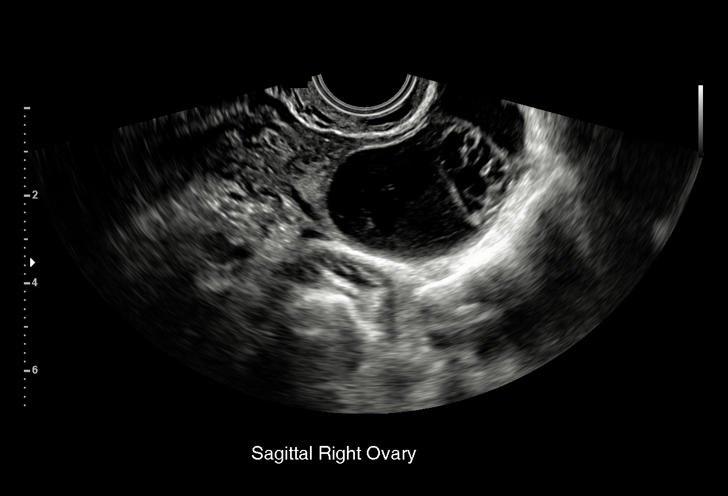
[im 18/33]
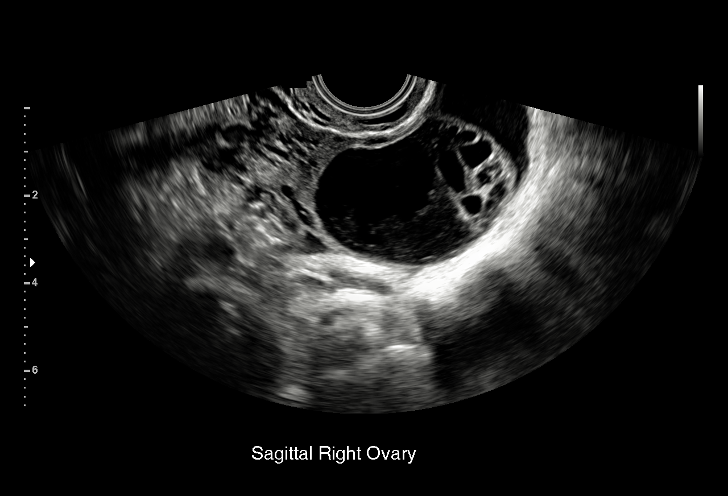
[im 21/33]
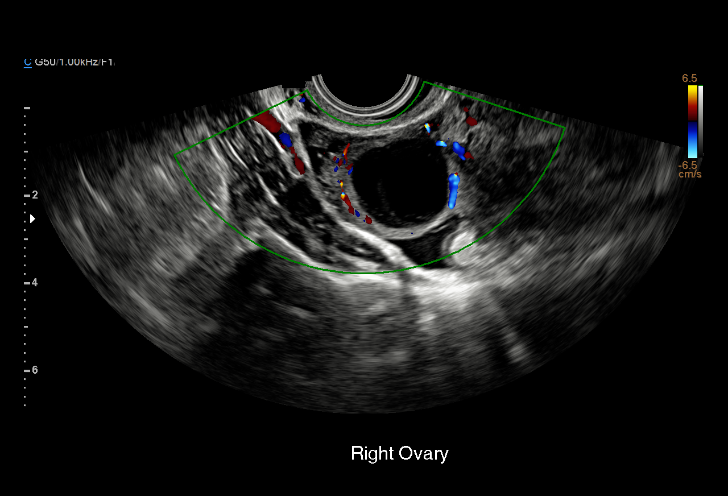
[im 23/33]
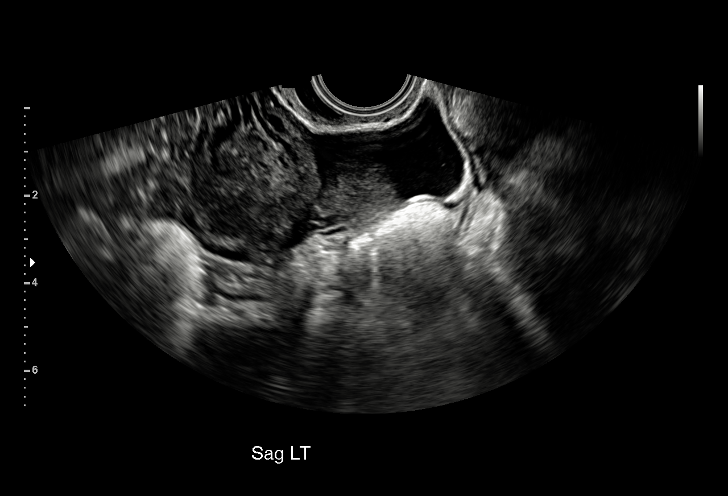
[im 25/33]
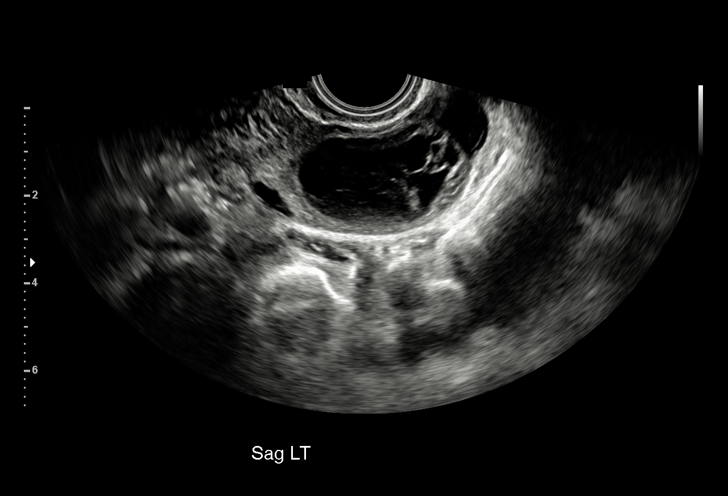
[im 28/33]
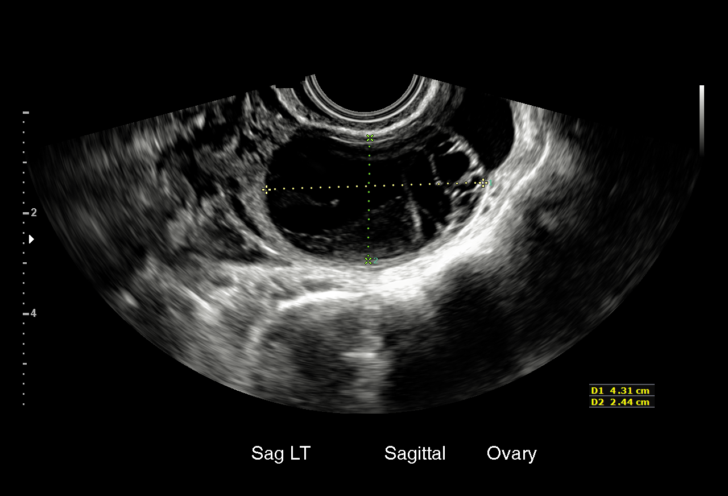
[im 30/33]
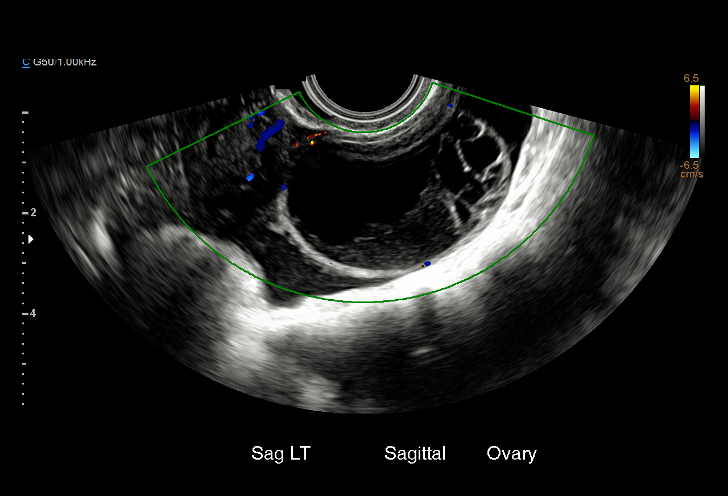
[im 33/33]
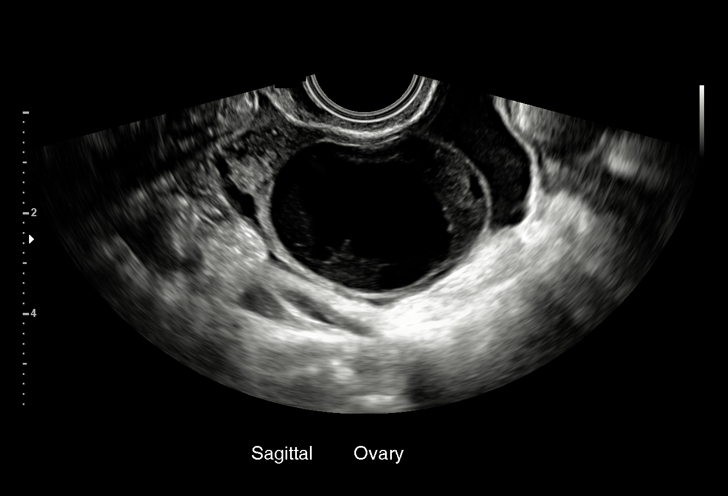

[15 of 28 positions shown; findings below may reference images not displayed]

FINDINGS: Intrauterine gestational sac: None

Yolk sac:  Not Visualized.

Embryo:  Not Visualized.

Subchorionic hemorrhage:  None visualized.

Maternal uterus/adnexae: Right ovary is notable for a 2.3 x 4.0 x
2.7 cm hemorrhagic cyst.

Left ovary is notable for a 4.3 x 2.4 x 4.2 cm hemorrhagic cyst.

Moderate pelvic ascites.
IMPRESSION: No IUP is visualized.

By definition, in the setting of a positive pregnancy test, this
reflects a pregnancy of unknown location. Differential
considerations include abnormal IUP/missed abortion or nonvisualized
ectopic pregnancy (favored).

Bilateral hemorrhagic cysts, as described above.

Moderate pelvic ascites.

## 2020-02-25 IMAGING — US US ART/VEN ABD/PELV/SCROTUM DOPPLER LTD
1 series · 13 of 25 positions shown · non-contrast
Comparison: None.

CLINICAL DATA: Right lower quadrant pain

EXAM:
TRANSABDOMINAL AND TRANSVAGINAL ULTRASOUND OF PELVIS
DOPPLER ULTRASOUND OF OVARIES
TECHNIQUE: Both transabdominal and transvaginal ultrasound examinations of the
pelvis were performed. Transabdominal technique was performed for
global imaging of the pelvis including uterus, ovaries, adnexal
regions, and pelvic cul-de-sac.
It was necessary to proceed with endovaginal exam following the
transabdominal exam to visualize the uterus, endometrium, ovaries
and adnexa. Color and duplex Doppler ultrasound was utilized to
evaluate blood flow to the ovaries.

[Series 1: us art/ven abd/pelv/scrotum doppler ltd · 13 of 76 slices shown]
[im 1/76]
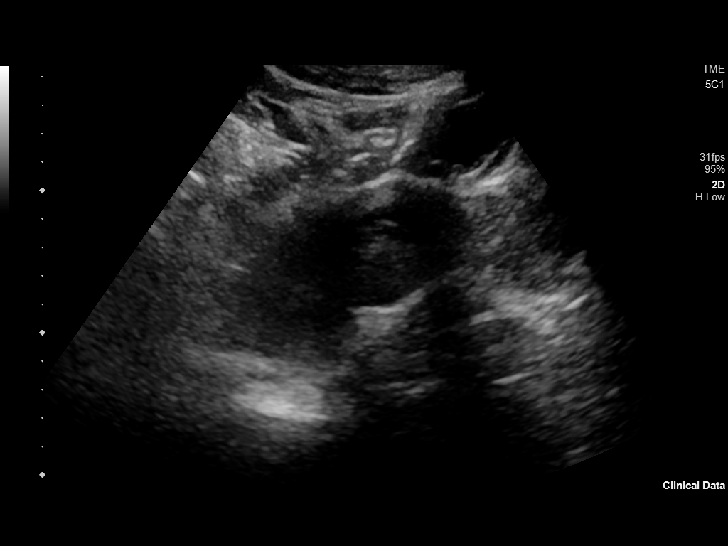
[im 7/76]
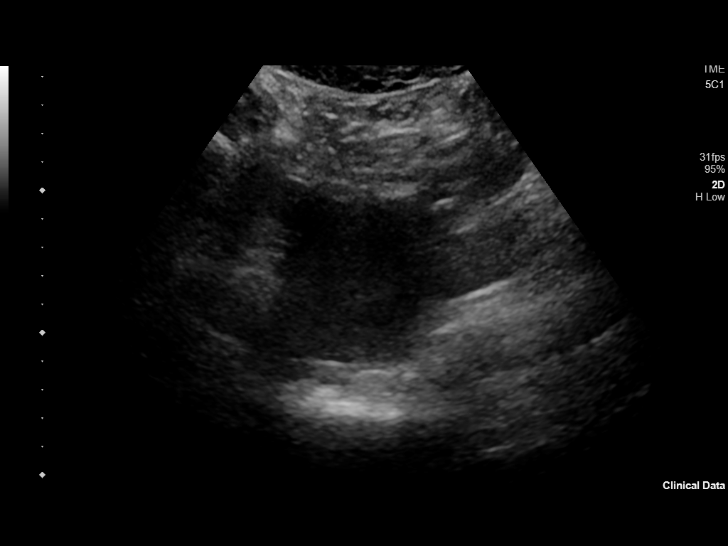
[im 13/76]
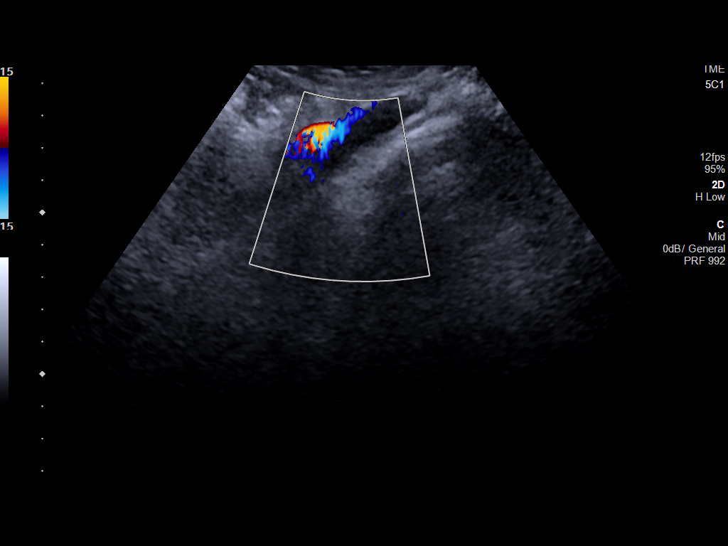
[im 19/76]
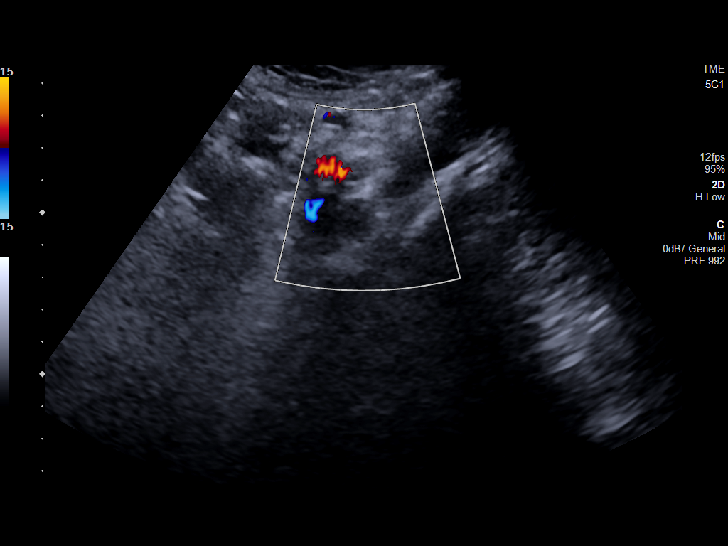
[im 26/76]
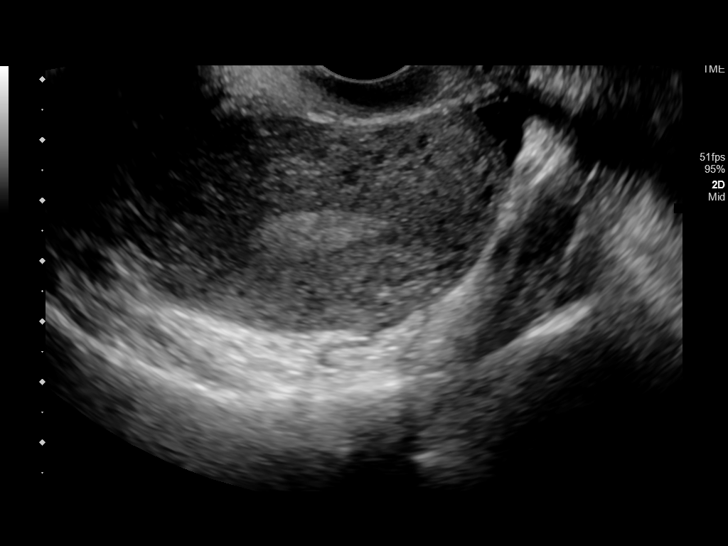
[im 32/76]
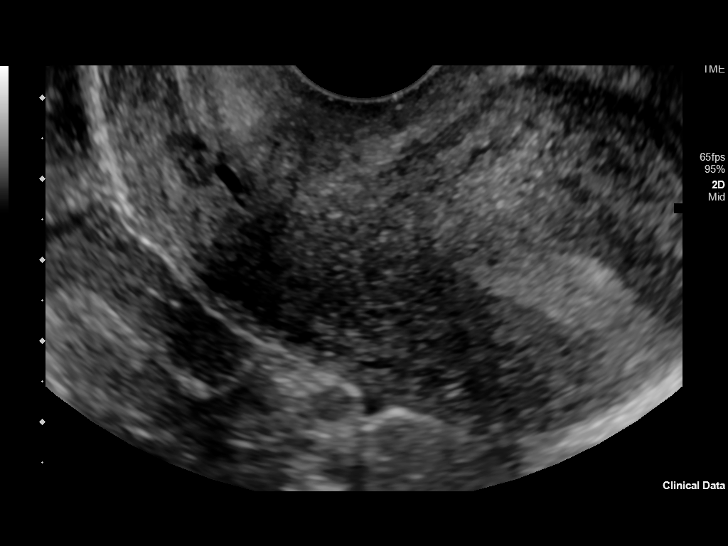
[im 38/76]
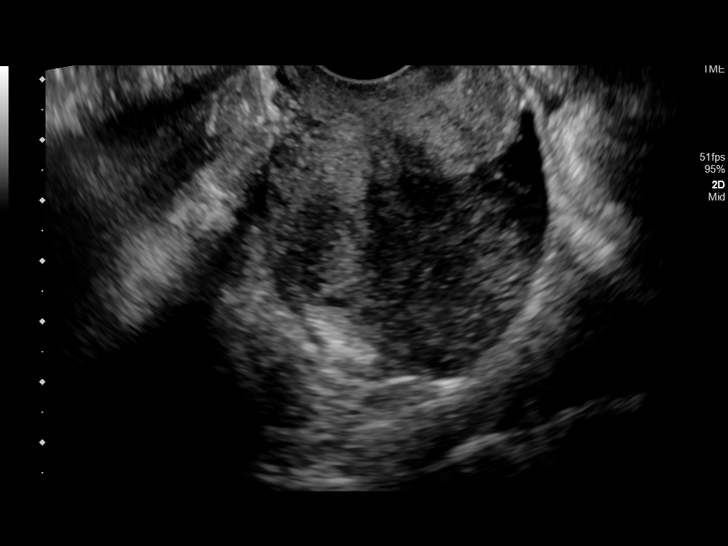
[im 44/76]
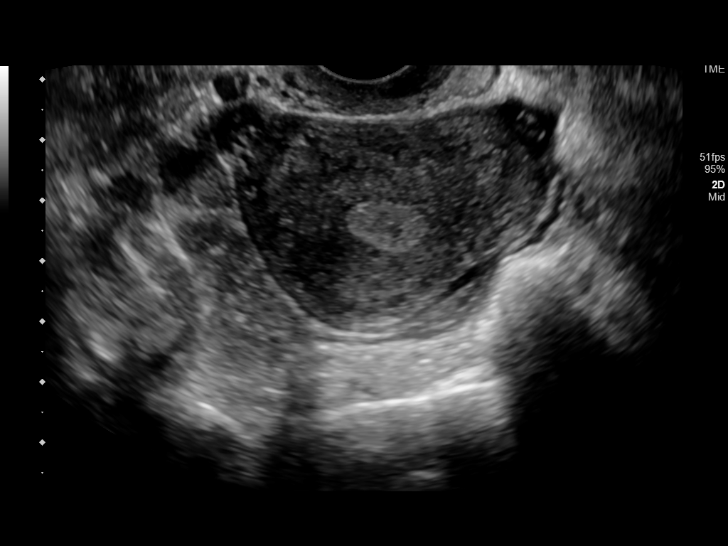
[im 51/76]
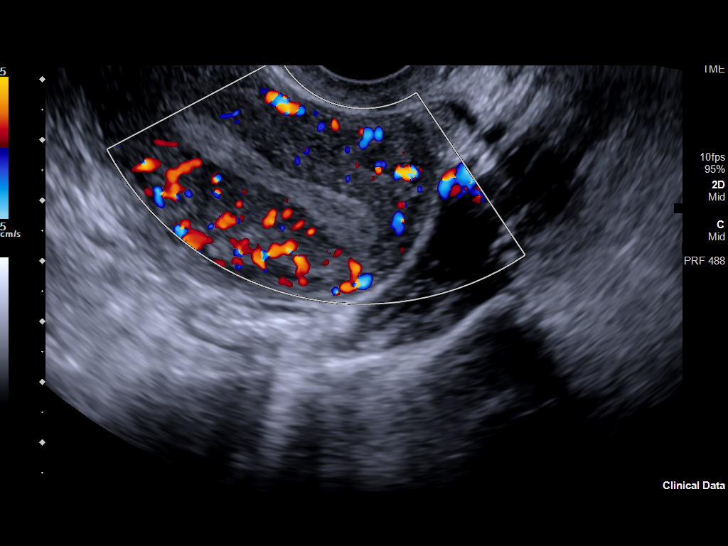
[im 57/76]
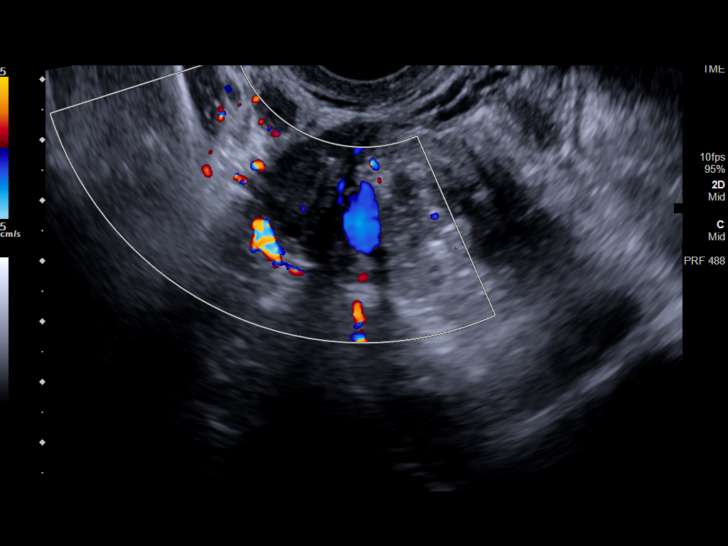
[im 63/76]
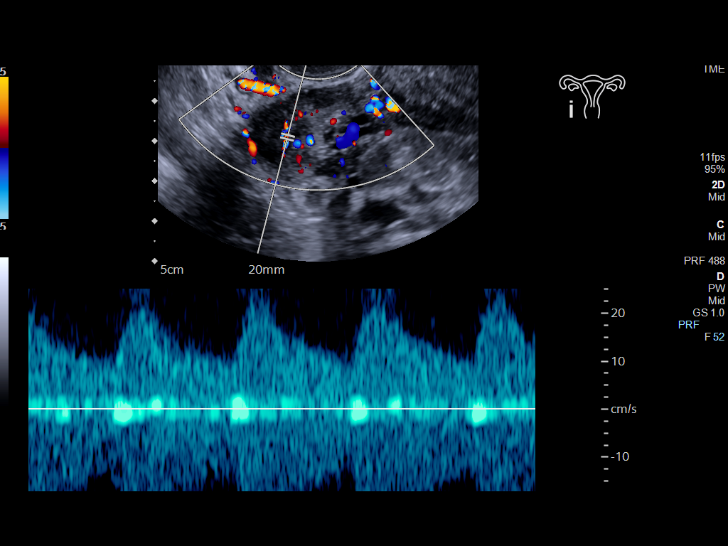
[im 69/76]
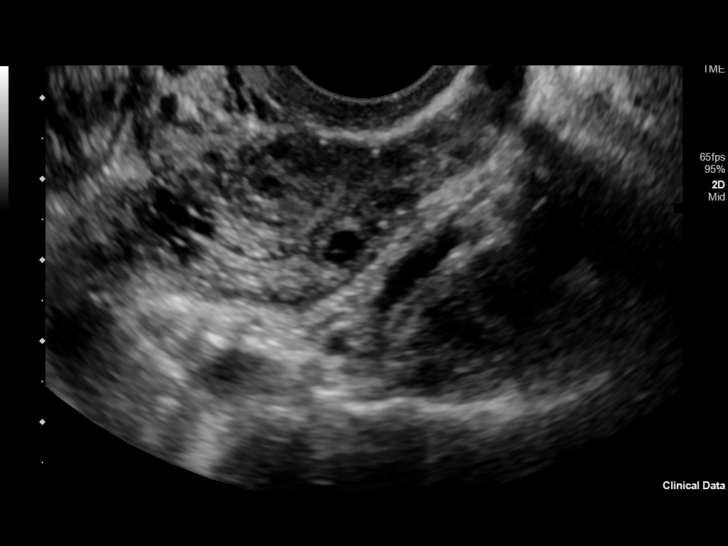
[im 76/76]
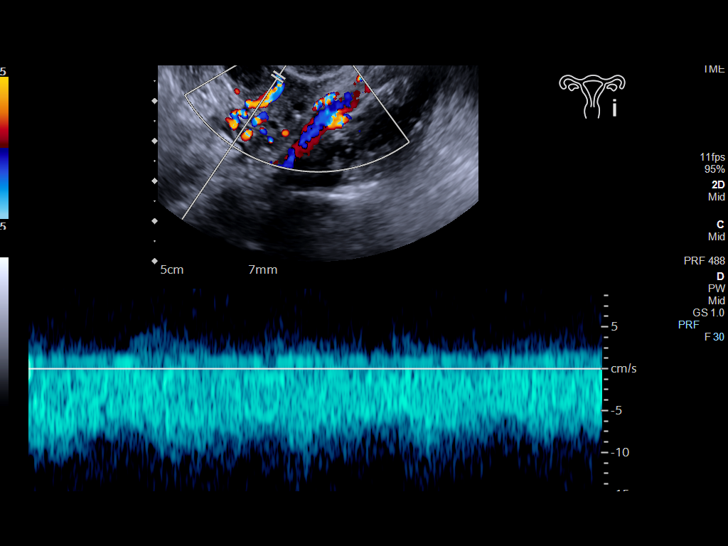

[13 of 25 positions shown; findings below may reference images not displayed]

FINDINGS: Uterus

Measurements: 6.3 x 4.1 x 5.1 cm, retroflexed. No fibroids or other
mass visualized.

Endometrium

Thickness: 9 mm in thickness.  No focal abnormality visualized.

Right ovary

Measurements: 3.9 x 2.0 x 2.3 cm. Normal appearance/no adnexal mass.

Left ovary

Measurements: 3.3 x 2.1 x 2.2 cm. Normal appearance/no adnexal mass.

Pulsed Doppler evaluation of both ovaries demonstrates normal
low-resistance arterial and venous waveforms.

Other findings

Trace free fluid.
IMPRESSION: Unremarkable pelvic ultrasound.

## 2020-02-26 IMAGING — CT CT RENAL STONE PROTOCOL
3 of 4 series · 8 of 46 positions shown, 15 images · non-contrast
Comparison: None.

CLINICAL DATA: Sudden onset right-sided abdominal pain.

EXAM:
CT ABDOMEN AND PELVIS WITHOUT CONTRAST
TECHNIQUE: Multidetector CT imaging of the abdomen and pelvis was performed
following the standard protocol without IV contrast.

[Series 4: lung bases · axial · 0.60mm/px · z∈[+1370,+1430]mm · 4 of 20 slices shown, 9 images]
[im 4/20  soft-tissue]
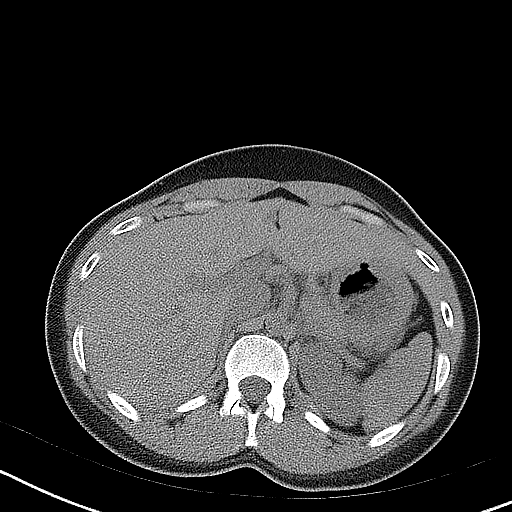
[im 4/20  lung]
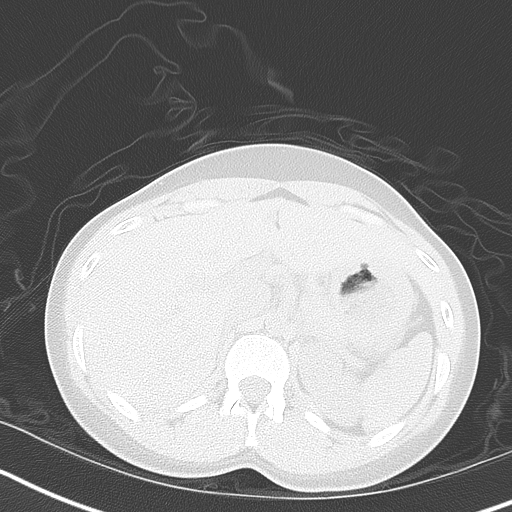
[im 4/20  bone]
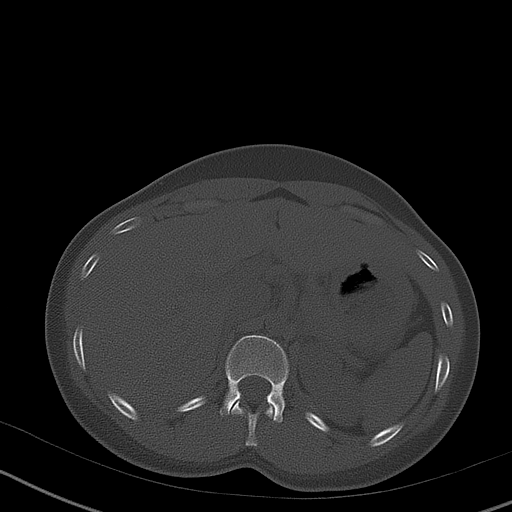
[im 8/20  soft-tissue]
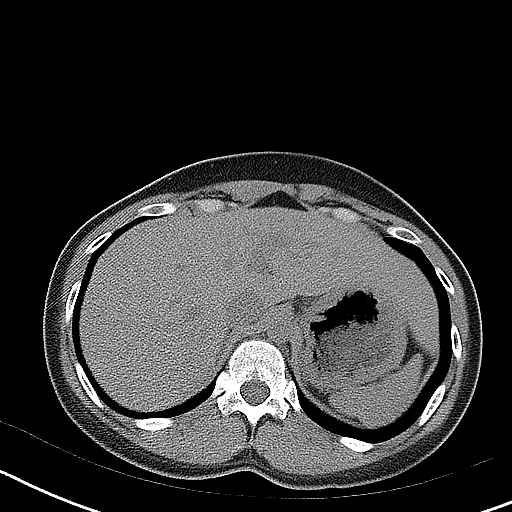
[im 8/20  lung]
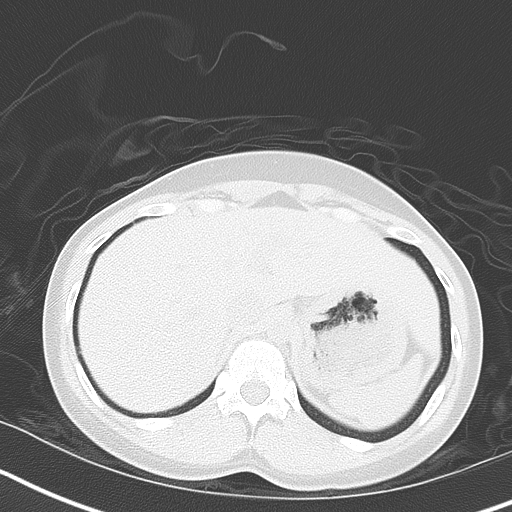
[im 12/20  soft-tissue]
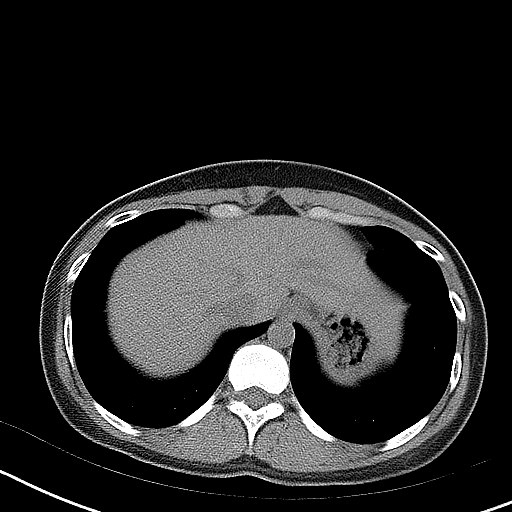
[im 12/20  lung]
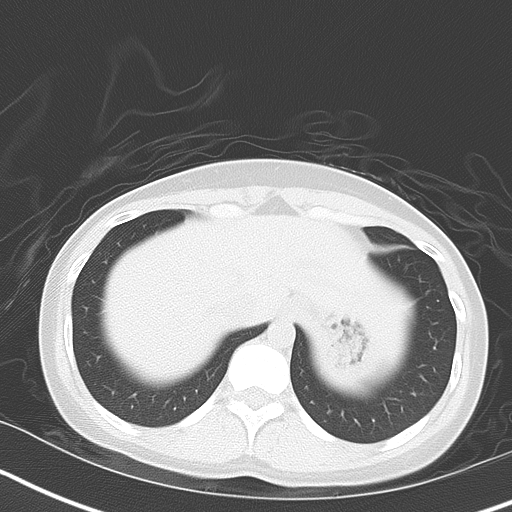
[im 16/20  soft-tissue]
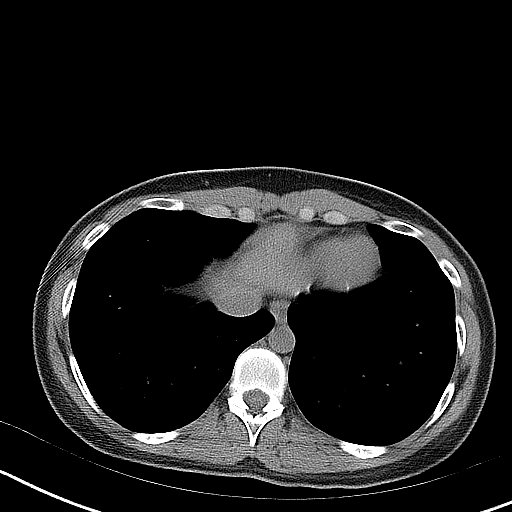
[im 16/20  lung]
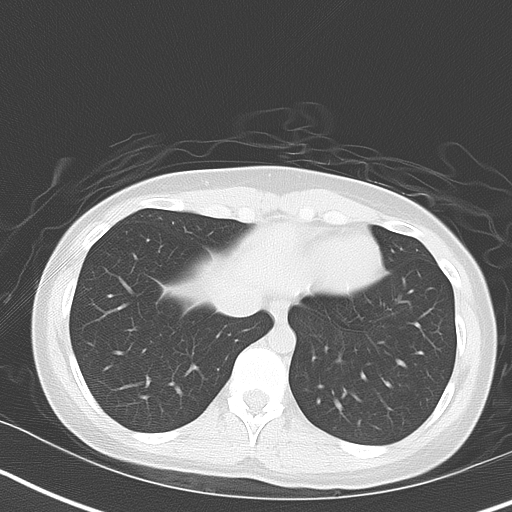

[Series 5: coronal · coronal · 0.62mm/px · 3 of 106 slices shown, 4 images]
[im 36/106  soft-tissue]
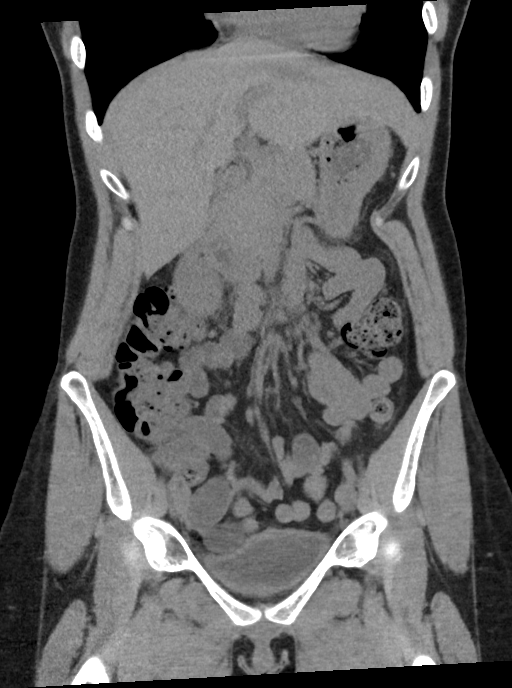
[im 47/106  soft-tissue]
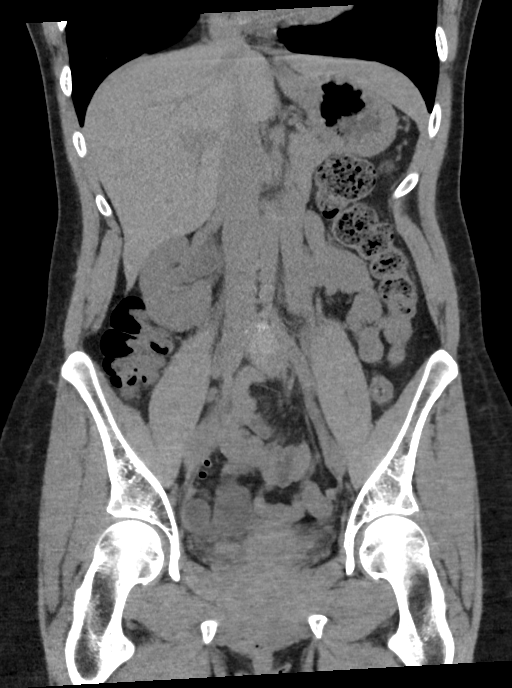
[im 47/106  bone]
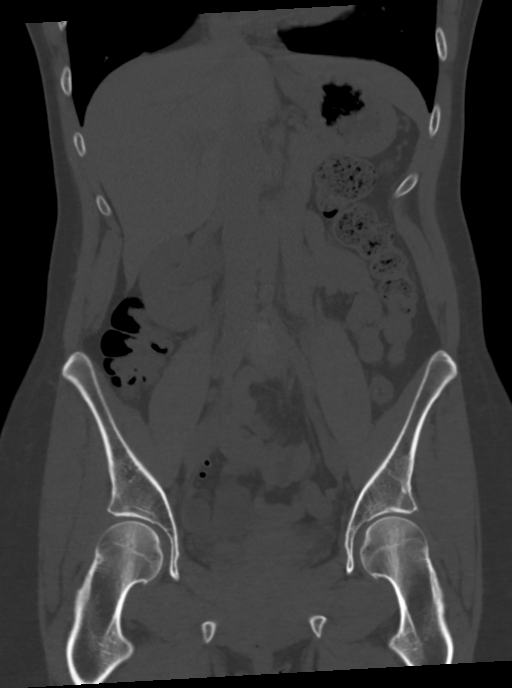
[im 59/106  soft-tissue]
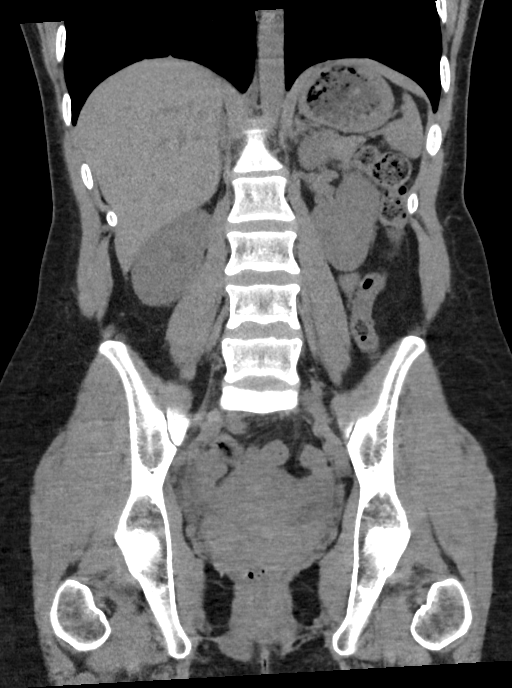

[Series 6: sagittal · sagittal · 0.41mm/px · 1 of 157 slices shown, 2 images]
[im 53/157  soft-tissue]
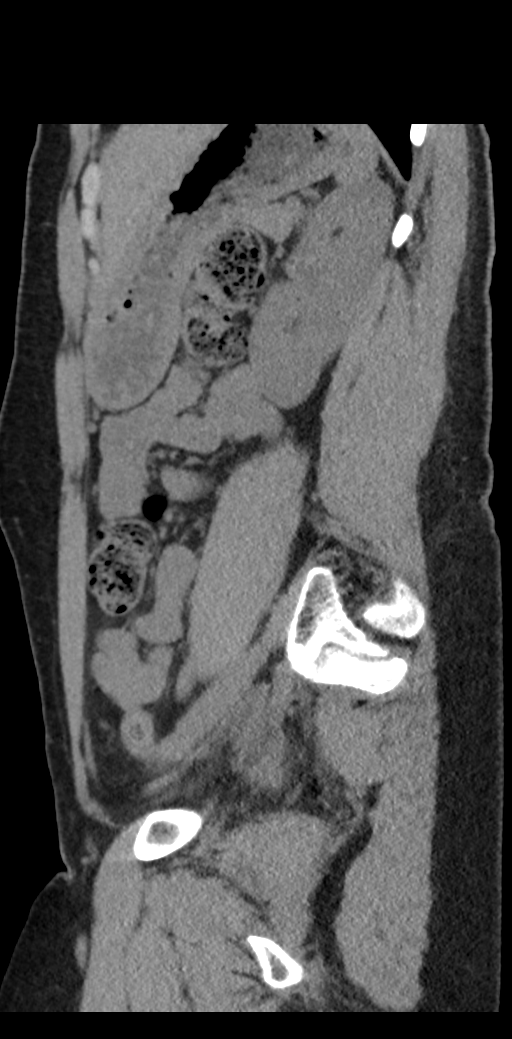
[im 53/157  bone]
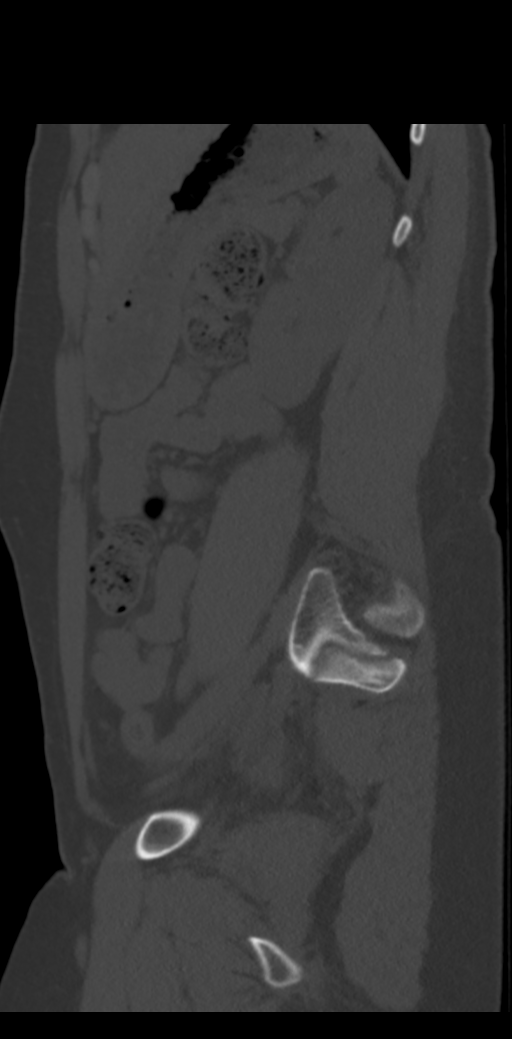

[8 of 46 positions shown; findings below may reference images not displayed]

FINDINGS: Lower chest: The included heart is unremarkable.  Clear lung bases.

Hepatobiliary: No focal liver abnormality is seen. No gallstones,
gallbladder wall thickening, or biliary dilatation.

Pancreas: Unremarkable. No pancreatic ductal dilatation or
surrounding inflammatory changes.

Spleen: Normal in size without focal abnormality.

Adrenals/Urinary Tract: Punctate nonobstructing renal calculi noted
without ureteral calculus, perinephric fat stranding nor
hydroureteronephrosis. Left kidney is unremarkable with possible
minimal nephrocalcinosis accounting for faint densities at the
cortical-medullary junction. The urinary bladder is nondistended and
free of calculi.

Stomach/Bowel: Normal appendix visualized. No bowel obstruction or
inflammation. Stomach is decompressed.

Vascular/Lymphatic: No significant vascular findings are present. No
enlarged abdominal or pelvic lymph nodes.

Reproductive: Uterus and bilateral adnexa are unremarkable.

Other: No abdominal wall hernia or abnormality. No abdominopelvic
ascites.

Musculoskeletal: No acute or significant osseous findings.
IMPRESSION: 1. Punctate nonobstructing right-sided renal calculi.
2. No obstructive uropathy.
3. Normal appendix.  No bowel obstruction or inflammation.

## 2024-02-13 ENCOUNTER — Encounter (HOSPITAL_BASED_OUTPATIENT_CLINIC_OR_DEPARTMENT_OTHER): Payer: Self-pay

## 2024-02-13 ENCOUNTER — Emergency Department (HOSPITAL_BASED_OUTPATIENT_CLINIC_OR_DEPARTMENT_OTHER)
Admission: EM | Admit: 2024-02-13 | Discharge: 2024-02-13 | Disposition: A | Discharge status: Home / Self Care | Payer: Self-pay | Attending: Emergency Medicine | Admitting: Emergency Medicine

## 2024-02-13 ENCOUNTER — Other Ambulatory Visit: Payer: Self-pay

## 2024-02-13 ENCOUNTER — Other Ambulatory Visit (HOSPITAL_BASED_OUTPATIENT_CLINIC_OR_DEPARTMENT_OTHER): Payer: Self-pay

## 2024-02-13 ENCOUNTER — Emergency Department (HOSPITAL_BASED_OUTPATIENT_CLINIC_OR_DEPARTMENT_OTHER): Payer: Self-pay

## 2024-02-13 DIAGNOSIS — Z91040 Latex allergy status: Secondary | ICD-10-CM | POA: Insufficient documentation

## 2024-02-13 DIAGNOSIS — R0789 Other chest pain: Secondary | ICD-10-CM | POA: Insufficient documentation

## 2024-02-13 DIAGNOSIS — R079 Chest pain, unspecified: Secondary | ICD-10-CM

## 2024-02-13 DIAGNOSIS — R0602 Shortness of breath: Secondary | ICD-10-CM | POA: Insufficient documentation

## 2024-02-13 LAB — BASIC METABOLIC PANEL WITH GFR
Anion gap: 16 — ABNORMAL HIGH (ref 5–15)
BUN: 9 mg/dL (ref 6–20)
CO2: 20 mmol/L — ABNORMAL LOW (ref 22–32)
Calcium: 9.7 mg/dL (ref 8.9–10.3)
Chloride: 104 mmol/L (ref 98–111)
Creatinine, Ser: 0.85 mg/dL (ref 0.44–1.00)
GFR, Estimated: >60 mL/min (ref >60)
Glucose, Bld: 93 mg/dL (ref 70–99)
Potassium: 3.8 mmol/L (ref 3.5–5.1)
Sodium: 140 mmol/L (ref 135–145)

## 2024-02-13 LAB — CBC
HCT: 36.3 % (ref 36.0–46.0)
Hemoglobin: 12.2 g/dL (ref 12.0–15.0)
MCH: 31 pg (ref 26.0–34.0)
MCHC: 33.6 g/dL (ref 30.0–36.0)
MCV: 92.1 fL (ref 80.0–100.0)
Platelets: 247 K/uL (ref 150–400)
RBC: 3.94 MIL/uL (ref 3.87–5.11)
RDW: 15.4 % (ref 11.5–15.5)
WBC: 5.8 K/uL (ref 4.0–10.5)
nRBC: 0 % (ref 0.0–0.2)

## 2024-02-13 LAB — PREGNANCY, URINE: Preg Test, Ur: NEGATIVE

## 2024-02-13 LAB — TROPONIN T, HIGH SENSITIVITY
Troponin T High Sensitivity: <15 ng/L (ref 0–19)
Troponin T High Sensitivity: <15 ng/L (ref 0–19)

## 2024-02-13 MED ORDER — ONDANSETRON 4 MG PO TBDP
4.0000 mg | ORAL_TABLET | Freq: Three times a day (TID) | ORAL | 0 refills | Status: AC | PRN
  Filled 2024-02-13: qty 10, 4d supply, fill #0

## 2024-02-13 MED ORDER — KETOROLAC TROMETHAMINE 15 MG/ML IJ SOLN
15.0000 mg | Freq: Once | INTRAMUSCULAR | Status: AC
  Administered 2024-02-13: 15 mg via INTRAVENOUS
  Filled 2024-02-13: qty 1

## 2024-02-13 MED ORDER — ONDANSETRON HCL 4 MG/2ML IJ SOLN
4.0000 mg | Freq: Once | INTRAMUSCULAR | Status: AC
  Administered 2024-02-13: 4 mg via INTRAVENOUS
  Filled 2024-02-13: qty 2

## 2024-02-13 NOTE — ED Notes (Signed)
 Discharge instructions, follow up care, and prescription reviewed and explained, pt verbalized understanding with no further questions on d/c.

## 2024-02-13 NOTE — ED Provider Notes (Signed)
 [place] EMERGENCY DEPARTMENT AT [facility] Provider Note   CSN: [id number] Arrival date & time: 02/13/24  9180     Patient presents with: Chest Pain and Shortness of Breath   Paula Molina is a 38 y.o. female.    Chest Pain Associated symptoms: shortness of breath   Shortness of Breath Associated symptoms: chest pain   Patient presents with chest pain shortness of breath.  Developed around 4 in the morning while having intercourse.  Has waxed and waned since then but has not gone away completely.  No swelling of her legs.  Is somewhat tearful.  States she has been under a lot of stress.  Pain is in the anterior mid chest.    Past Medical History:  Diagnosis Date   Medical history non-contributory     Prior to Admission medications   Medication Sig Start Date End Date Taking? Authorizing Provider  Aspirin-Acetaminophen -Caffeine (GOODY HEADACHE PO) Take 1 each by mouth daily as needed (For pain.).    [provider]  ondansetron  (ZOFRAN -ODT) 4 MG disintegrating tablet Take 1 tablet (4 mg total) by mouth every 8 (eight) hours as needed for nausea or vomiting. 02/13/24   Patsey Lot, MD  traMADol  (ULTRAM ) 50 MG tablet Take 1 tablet (50 mg total) by mouth every 6 (six) hours as needed. 11/09/17   Charlann Isaiah SQUIBB, MD    Allergies: Shellfish allergy and Latex    Review of Systems  Respiratory:  Positive for shortness of breath.   Cardiovascular:  Positive for chest pain.    Updated Vital Signs BP 117/74   Pulse 60   Temp 98.1 F (36.7 C)   Resp 19   Ht 5' 4 (1.626 m)   Wt 59 kg   SpO2 100%   BMI 22.31 kg/m   Physical Exam Vitals and nursing note reviewed.  Cardiovascular:     Rate and Rhythm: Normal rate.  Pulmonary:     Breath sounds: No decreased breath sounds or wheezing.  Chest:     Chest wall: No tenderness.  Musculoskeletal:     Right lower leg: No edema.     Left lower leg: No edema.  Skin:    General: Skin is  warm.  Neurological:     Mental Status: She is alert.  Psychiatric:     Comments: Patient is tearful.     (all labs ordered are listed, but only abnormal results are displayed) Labs Reviewed  BASIC METABOLIC PANEL WITH GFR - Abnormal; Notable for the following components:      Result Value   CO2 20 (*)    Anion gap 16 (*)    All other components within normal limits  CBC  PREGNANCY, URINE  TROPONIN T, HIGH SENSITIVITY  TROPONIN T, HIGH SENSITIVITY    EKG: EKG Interpretation Date/Time:  Friday February 13 2024 08:29:27 EDT Ventricular Rate:  77 PR Interval:  184 QRS Duration:  80 QT Interval:  385 QTC Calculation: 436 R Axis:   50  Text Interpretation: Sinus rhythm RSR' in V1 or V2, probably normal variant ST elev, probable normal early repol pattern No old tracing to compare Confirmed by Patsey Lot [phone]) on 02/13/2024 8:34:17 AM  Radiology: [facility] Chest Port 1 View Result Date: 02/13/2024 CLINICAL DATA:  Chest pain and shortness of breath. EXAM: PORTABLE CHEST 1 VIEW COMPARISON:  None Available. FINDINGS: The lungs are clear without focal pneumonia, edema, pneumothorax or pleural effusion. The cardiopericardial silhouette is within normal limits for size. No  acute bony abnormality. Telemetry leads overlie the chest. IMPRESSION: No active disease. Electronically Signed   By: Camellia Candle M.D.   On: 02/13/2024 09:22     Procedures   Medications Ordered in the ED  ketorolac  (TORADOL ) 15 MG/ML injection 15 mg (15 mg Intravenous Given 02/13/24 1024)  ondansetron  (ZOFRAN ) injection 4 mg (4 mg Intravenous Given 02/13/24 1119)                                    Medical Decision Making Amount and/or Complexity of Data Reviewed Labs: ordered. Radiology: ordered.  Risk Prescription drug management.   Patient anterior chest pain.  Mid chest.  EKG reassuring.  Differential diagnose includes cardiac cause, nonspecific chest pain, pneumothorax, anxiety.  Will get x-ray and  blood work.  Blood work reassuring.  Troponin negative.  Chest x-ray reassuring.  Doubt cardiac ischemia.  Doubt pulmonary embolism.  Likely does have an anxiety component to it.  Do not think we need further workup at this time.  Had discussed about potential anxiety treatment but patient does not want the medicines I offered.  Will discharge home.      Final diagnoses:  Nonspecific chest pain    ED Discharge Orders          Ordered    ondansetron  (ZOFRAN -ODT) 4 MG disintegrating tablet  Every 8 hours PRN        02/13/24 1053               Patsey Lot, MD 02/13/24 1523

## 2024-02-13 NOTE — ED Triage Notes (Signed)
 Arrives POV with complaints of developing chest pain and shortness of breath this morning while having intercourse with her partner. Patient reports that she has not had these symptoms occur before. Chest pain a 3/10 on arrival.

## 2024-02-13 NOTE — ED Notes (Signed)
 Pt aware a urine sample is needed. Pt cannot go at this time.

## 2024-02-24 ENCOUNTER — Other Ambulatory Visit (HOSPITAL_BASED_OUTPATIENT_CLINIC_OR_DEPARTMENT_OTHER): Payer: Self-pay
